# Patient Record
Sex: Female | Born: 1950
Health system: Southern US, Community
[De-identification: ages and names within clinical notes are randomized; demographics above are authoritative.]

## PROBLEM LIST (undated history)

## (undated) DIAGNOSIS — E785 Hyperlipidemia, unspecified: Secondary | ICD-10-CM

## (undated) DIAGNOSIS — N390 Urinary tract infection, site not specified: Secondary | ICD-10-CM

## (undated) DIAGNOSIS — J302 Other seasonal allergic rhinitis: Secondary | ICD-10-CM

## (undated) DIAGNOSIS — D696 Thrombocytopenia, unspecified: Secondary | ICD-10-CM

## (undated) HISTORY — PX: COLONOSCOPY: SHX174

---

## 1998-02-19 ENCOUNTER — Other Ambulatory Visit: Admission: RE | Admit: 1998-02-19 | Discharge: 1998-02-19 | Payer: Self-pay | Admitting: Family Medicine

## 1999-02-18 ENCOUNTER — Other Ambulatory Visit: Admission: RE | Admit: 1999-02-18 | Discharge: 1999-02-18 | Payer: Self-pay | Admitting: Family Medicine

## 2000-02-24 ENCOUNTER — Other Ambulatory Visit: Admission: RE | Admit: 2000-02-24 | Discharge: 2000-02-24 | Payer: Self-pay | Admitting: Family Medicine

## 2000-05-14 ENCOUNTER — Emergency Department (HOSPITAL_COMMUNITY): Admission: EM | Admit: 2000-05-14 | Discharge: 2000-05-14 | Payer: Self-pay | Admitting: Emergency Medicine

## 2003-10-05 ENCOUNTER — Ambulatory Visit (HOSPITAL_COMMUNITY): Admission: RE | Admit: 2003-10-05 | Discharge: 2003-10-05 | Payer: Self-pay | Admitting: *Deleted

## 2003-10-05 ENCOUNTER — Encounter (INDEPENDENT_AMBULATORY_CARE_PROVIDER_SITE_OTHER): Payer: Self-pay | Admitting: Specialist

## 2004-11-25 ENCOUNTER — Other Ambulatory Visit: Admission: RE | Admit: 2004-11-25 | Discharge: 2004-11-25 | Payer: Self-pay | Admitting: Family Medicine

## 2004-12-19 ENCOUNTER — Ambulatory Visit (HOSPITAL_COMMUNITY): Admission: RE | Admit: 2004-12-19 | Discharge: 2004-12-19 | Payer: Self-pay | Admitting: *Deleted

## 2004-12-19 ENCOUNTER — Encounter (INDEPENDENT_AMBULATORY_CARE_PROVIDER_SITE_OTHER): Payer: Self-pay | Admitting: Specialist

## 2005-07-09 ENCOUNTER — Emergency Department (HOSPITAL_COMMUNITY): Admission: EM | Admit: 2005-07-09 | Discharge: 2005-07-09 | Payer: Self-pay | Admitting: Family Medicine

## 2006-08-20 ENCOUNTER — Other Ambulatory Visit: Admission: RE | Admit: 2006-08-20 | Discharge: 2006-08-20 | Payer: Self-pay | Admitting: Family Medicine

## 2007-11-30 ENCOUNTER — Other Ambulatory Visit: Admission: RE | Admit: 2007-11-30 | Discharge: 2007-11-30 | Payer: Self-pay | Admitting: Family Medicine

## 2009-02-22 ENCOUNTER — Other Ambulatory Visit: Admission: RE | Admit: 2009-02-22 | Discharge: 2009-02-22 | Payer: Self-pay | Admitting: Family Medicine

## 2011-02-25 ENCOUNTER — Other Ambulatory Visit: Payer: Self-pay | Admitting: Family Medicine

## 2011-02-25 ENCOUNTER — Other Ambulatory Visit (HOSPITAL_COMMUNITY)
Admission: RE | Admit: 2011-02-25 | Discharge: 2011-02-25 | Disposition: A | Discharge status: Home / Self Care | Payer: BC Managed Care – PPO | Source: Ambulatory Visit | Attending: Family Medicine | Admitting: Family Medicine

## 2011-02-25 DIAGNOSIS — Z124 Encounter for screening for malignant neoplasm of cervix: Secondary | ICD-10-CM | POA: Insufficient documentation

## 2013-01-04 ENCOUNTER — Other Ambulatory Visit: Payer: Self-pay | Admitting: Family Medicine

## 2013-01-04 DIAGNOSIS — M949 Disorder of cartilage, unspecified: Secondary | ICD-10-CM

## 2013-10-10 ENCOUNTER — Other Ambulatory Visit: Payer: Self-pay | Admitting: Family Medicine

## 2013-10-10 ENCOUNTER — Other Ambulatory Visit (HOSPITAL_COMMUNITY)
Admission: RE | Admit: 2013-10-10 | Discharge: 2013-10-10 | Disposition: A | Discharge status: Home / Self Care | Payer: BC Managed Care – PPO | Source: Ambulatory Visit | Attending: Family Medicine | Admitting: Family Medicine

## 2013-10-10 DIAGNOSIS — Z1151 Encounter for screening for human papillomavirus (HPV): Secondary | ICD-10-CM | POA: Insufficient documentation

## 2013-10-10 DIAGNOSIS — Z124 Encounter for screening for malignant neoplasm of cervix: Secondary | ICD-10-CM | POA: Insufficient documentation

## 2014-06-20 ENCOUNTER — Ambulatory Visit: Payer: BC Managed Care – PPO | Attending: Family Medicine | Admitting: Physical Therapy

## 2014-06-20 DIAGNOSIS — M25619 Stiffness of unspecified shoulder, not elsewhere classified: Secondary | ICD-10-CM | POA: Insufficient documentation

## 2014-06-20 DIAGNOSIS — M949 Disorder of cartilage, unspecified: Secondary | ICD-10-CM

## 2014-06-20 DIAGNOSIS — M25519 Pain in unspecified shoulder: Secondary | ICD-10-CM | POA: Insufficient documentation

## 2014-06-20 DIAGNOSIS — M899 Disorder of bone, unspecified: Secondary | ICD-10-CM | POA: Diagnosis not present

## 2014-06-20 DIAGNOSIS — IMO0001 Reserved for inherently not codable concepts without codable children: Secondary | ICD-10-CM | POA: Diagnosis not present

## 2014-06-25 ENCOUNTER — Ambulatory Visit: Payer: BC Managed Care – PPO | Attending: Family Medicine | Admitting: Physical Therapy

## 2014-06-25 DIAGNOSIS — M8580 Other specified disorders of bone density and structure, unspecified site: Secondary | ICD-10-CM | POA: Insufficient documentation

## 2014-06-25 DIAGNOSIS — Z5189 Encounter for other specified aftercare: Secondary | ICD-10-CM | POA: Diagnosis present

## 2014-06-25 DIAGNOSIS — M25512 Pain in left shoulder: Secondary | ICD-10-CM | POA: Insufficient documentation

## 2014-06-25 DIAGNOSIS — M25612 Stiffness of left shoulder, not elsewhere classified: Secondary | ICD-10-CM | POA: Insufficient documentation

## 2014-06-27 ENCOUNTER — Ambulatory Visit: Payer: BC Managed Care – PPO | Admitting: Physical Therapy

## 2014-06-27 DIAGNOSIS — Z5189 Encounter for other specified aftercare: Secondary | ICD-10-CM | POA: Diagnosis not present

## 2014-06-29 ENCOUNTER — Ambulatory Visit: Payer: BC Managed Care – PPO | Admitting: Physical Therapy

## 2014-06-29 DIAGNOSIS — Z5189 Encounter for other specified aftercare: Secondary | ICD-10-CM | POA: Diagnosis not present

## 2014-07-02 ENCOUNTER — Ambulatory Visit: Payer: BC Managed Care – PPO | Admitting: Physical Therapy

## 2014-07-02 DIAGNOSIS — Z5189 Encounter for other specified aftercare: Secondary | ICD-10-CM | POA: Diagnosis not present

## 2014-07-04 ENCOUNTER — Ambulatory Visit: Payer: BC Managed Care – PPO | Admitting: Physical Therapy

## 2014-07-04 DIAGNOSIS — Z5189 Encounter for other specified aftercare: Secondary | ICD-10-CM | POA: Diagnosis not present

## 2014-07-06 ENCOUNTER — Ambulatory Visit: Payer: BC Managed Care – PPO | Admitting: Physical Therapy

## 2014-07-09 ENCOUNTER — Ambulatory Visit: Payer: BC Managed Care – PPO | Admitting: Physical Therapy

## 2014-07-09 DIAGNOSIS — Z5189 Encounter for other specified aftercare: Secondary | ICD-10-CM | POA: Diagnosis not present

## 2014-07-11 ENCOUNTER — Encounter: Payer: BC Managed Care – PPO | Admitting: Physical Therapy

## 2014-07-13 ENCOUNTER — Ambulatory Visit: Payer: BC Managed Care – PPO | Admitting: Physical Therapy

## 2014-07-13 DIAGNOSIS — Z5189 Encounter for other specified aftercare: Secondary | ICD-10-CM | POA: Diagnosis not present

## 2014-07-16 ENCOUNTER — Ambulatory Visit: Payer: BC Managed Care – PPO | Admitting: Physical Therapy

## 2014-07-16 DIAGNOSIS — Z5189 Encounter for other specified aftercare: Secondary | ICD-10-CM | POA: Diagnosis not present

## 2014-07-18 ENCOUNTER — Encounter: Payer: BC Managed Care – PPO | Admitting: Physical Therapy

## 2014-07-20 ENCOUNTER — Encounter: Payer: BC Managed Care – PPO | Admitting: Physical Therapy

## 2014-07-23 ENCOUNTER — Encounter: Payer: BC Managed Care – PPO | Admitting: Physical Therapy

## 2014-07-25 ENCOUNTER — Encounter: Payer: BC Managed Care – PPO | Admitting: Physical Therapy

## 2014-07-27 ENCOUNTER — Encounter: Payer: BC Managed Care – PPO | Admitting: Physical Therapy

## 2016-11-17 ENCOUNTER — Other Ambulatory Visit: Payer: Self-pay | Admitting: Family Medicine

## 2016-11-17 ENCOUNTER — Other Ambulatory Visit (HOSPITAL_COMMUNITY)
Admission: RE | Admit: 2016-11-17 | Discharge: 2016-11-17 | Disposition: A | Discharge status: Home / Self Care | Payer: BC Managed Care – PPO | Source: Ambulatory Visit | Attending: Family Medicine | Admitting: Family Medicine

## 2016-11-17 DIAGNOSIS — Z01411 Encounter for gynecological examination (general) (routine) with abnormal findings: Secondary | ICD-10-CM | POA: Diagnosis present

## 2016-11-17 DIAGNOSIS — Z1151 Encounter for screening for human papillomavirus (HPV): Secondary | ICD-10-CM | POA: Diagnosis present

## 2016-11-18 LAB — CYTOLOGY - PAP
Diagnosis: NEGATIVE
HPV: NOT DETECTED

## 2017-04-29 ENCOUNTER — Encounter (HOSPITAL_COMMUNITY): Payer: Self-pay | Admitting: Emergency Medicine

## 2017-04-29 ENCOUNTER — Emergency Department (HOSPITAL_COMMUNITY)
Admission: EM | Admit: 2017-04-29 | Discharge: 2017-04-29 | Disposition: A | Discharge status: Home / Self Care | Payer: BC Managed Care – PPO | Attending: Emergency Medicine | Admitting: Emergency Medicine

## 2017-04-29 ENCOUNTER — Emergency Department (HOSPITAL_COMMUNITY): Payer: BC Managed Care – PPO

## 2017-04-29 DIAGNOSIS — Y929 Unspecified place or not applicable: Secondary | ICD-10-CM | POA: Diagnosis not present

## 2017-04-29 DIAGNOSIS — W1830XA Fall on same level, unspecified, initial encounter: Secondary | ICD-10-CM | POA: Diagnosis not present

## 2017-04-29 DIAGNOSIS — Y999 Unspecified external cause status: Secondary | ICD-10-CM | POA: Insufficient documentation

## 2017-04-29 DIAGNOSIS — S82032A Displaced transverse fracture of left patella, initial encounter for closed fracture: Secondary | ICD-10-CM | POA: Insufficient documentation

## 2017-04-29 DIAGNOSIS — Y939 Activity, unspecified: Secondary | ICD-10-CM | POA: Diagnosis not present

## 2017-04-29 DIAGNOSIS — S8992XA Unspecified injury of left lower leg, initial encounter: Secondary | ICD-10-CM | POA: Diagnosis present

## 2017-04-29 HISTORY — DX: Other seasonal allergic rhinitis: J30.2

## 2017-04-29 HISTORY — DX: Urinary tract infection, site not specified: N39.0

## 2017-04-29 HISTORY — DX: Hyperlipidemia, unspecified: E78.5

## 2017-04-29 MED ORDER — MORPHINE SULFATE 15 MG PO TABS: 15.0000 mg | ORAL_TABLET | ORAL | 0 refills | Status: DC | PRN

## 2017-04-29 NOTE — ED Notes (Signed)
Ice applied. Warm blanket given. Call bell within reach.

## 2017-04-29 NOTE — Discharge Instructions (Signed)

## 2017-04-29 NOTE — ED Notes (Signed)
Bed: [id number] Expected date:  Expected time:  Means of arrival:  Comments: EMS 66 yo F left knee deformity

## 2017-04-29 NOTE — ED Provider Notes (Signed)
[facility]-EMERGENCY DEPT Provider Note   CSN: [id number] Arrival date & time: 04/29/17  4098     History   Chief Complaint Chief Complaint  Patient presents with  . Fall  . Knee Pain    HPI Alejandra Thompson is a 66 y.o. female.  66 yo F with a chief complaint of left knee pain. The patient slipped on a recently clean floor and landed on her kneecap. Noted deformity to the area. Having significant pain with flexion of her knee. When it is straight she feels that she has no tenderness. Denies other injury. Denies prior issues with that knee.   The history is provided by the patient.  Fall  This is a new problem. The current episode started less than 1 hour ago. The problem occurs constantly. The problem has not changed since onset.Pertinent negatives include no chest pain, no headaches and no shortness of breath. The symptoms are aggravated by bending and twisting (flexion). Nothing relieves the symptoms. She has tried nothing for the symptoms. The treatment provided no relief.  Knee Pain      Past Medical History:  Diagnosis Date  . Hyperlipemia   . Seasonal allergies   . UTI (urinary tract infection)     There are no active problems to display for this patient.   History reviewed. No pertinent surgical history.  OB History    No data available       Home Medications    Prior to Admission medications   Medication Sig Start Date End Date Taking? Authorizing Provider  Aspirin-Acetaminophen-Caffeine (GOODY HEADACHE PO) Take 1 packet by mouth every 6 (six) hours as needed (for pain).   Yes [provider]  BIOTIN PO Take 2 each by mouth daily.   Yes [provider]  ibuprofen (ADVIL,MOTRIN) 200 MG tablet Take 400 mg by mouth at bedtime.   Yes [provider]  montelukast (SINGULAIR) 10 MG tablet Take 10 mg by mouth daily.    Yes [provider]  nitrofurantoin (MACRODANTIN) 100 MG capsule Take 100 mg by mouth daily.   Yes [provider]  simvastatin (ZOCOR) 20 MG tablet Take 20 mg by mouth daily.   Yes [provider]  zolpidem (AMBIEN) 5 MG tablet Take 5 mg by mouth at bedtime as needed for sleep.   Yes [provider]  morphine (MSIR) 15 MG tablet Take 1 tablet (15 mg total) by mouth every 4 (four) hours as needed for severe pain. 04/29/17   [name], DO    Family History No family history on file.  Social History Social History  Substance Use Topics  . Smoking status: Former Games developer  . Smokeless tobacco: Never Used  . Alcohol use Yes     Comment: social     Allergies   Patient has no known allergies.   Review of Systems Review of Systems  Constitutional: Negative for chills and fever.  HENT: Negative for congestion and rhinorrhea.   Eyes: Negative for redness and visual disturbance.  Respiratory: Negative for shortness of breath and wheezing.   Cardiovascular: Negative for chest pain and palpitations.  Gastrointestinal: Negative for nausea and vomiting.  Genitourinary: Negative for dysuria and urgency.  Musculoskeletal: Positive for arthralgias. Negative for myalgias.  Skin: Negative for pallor and wound.  Neurological: Negative for dizziness and headaches.     Physical Exam Updated Vital Signs BP 125/66 (BP Location: Left Arm)   Pulse 60   Temp 98.2 F (36.8 C) (Oral)  Resp 16   Ht 5\' 7"  (1.702 m)   Wt 77.1 kg (170 lb)   SpO2 100%   BMI 26.63 kg/m   Physical Exam  Constitutional: She is oriented to person, place, and time. She appears well-developed and well-nourished. No distress.  HENT:  Head: Normocephalic and atraumatic.  Eyes: Pupils are equal, round, and reactive to light. EOM are normal.  Neck: Normal range of motion. Neck supple.  Cardiovascular: Normal rate and regular rhythm.  Exam reveals no gallop and no friction rub.   No murmur heard. Pulmonary/Chest: Effort normal. She has no wheezes. She has no rales.  Abdominal: Soft. She exhibits no  distension. There is no tenderness.  Musculoskeletal: She exhibits edema and tenderness.  Pulse motor and sensation is intact to the left lower extremity. She is unable to raise her leg off the bed. Deformity noted to the patella  Neurological: She is alert and oriented to person, place, and time.  Skin: Skin is warm and dry. She is not diaphoretic.  Psychiatric: She has a normal mood and affect. Her behavior is normal.  Nursing note and vitals reviewed.    ED Treatments / Results  Labs (all labs ordered are listed, but only abnormal results are displayed) Labs Reviewed - No data to display  EKG  EKG Interpretation None       Radiology Dg Knee Complete 4 Views Left  Result Date: 04/29/2017 CLINICAL DATA:  Fall at school this morning with left knee pain. Initial encounter. EXAM: LEFT KNEE - COMPLETE 4+ VIEW COMPARISON:  None. FINDINGS: Comminuted and distracted fracture horizontally across the mid patella. Associated soft tissue swelling. No noted femur, tibial, or fibular fracture. IMPRESSION: Distracted transverse mid patella fracture. Electronically Signed   By: Marnee SpringJonathon  Watts M.D.   On: 04/29/2017 10:36    Procedures Procedures (including critical care time)  Medications Ordered in ED Medications - No data to display   Initial Impression / Assessment and Plan / ED Course  I have reviewed the triage vital signs and the nursing notes.  Pertinent labs & imaging results that were available during my care of the patient were reviewed by me and considered in my medical decision making (see chart for details).     66 yo F with a chief complaint of left knee pain. Clinically patient has a fractured patella. Will obtain a plain film.  Grossly displaced patellar fx.  Unable to straight leg raise.  Discussed with Dr. Linna CapriceSwinteck, recommended knee immobilizer crutches.  Follow up early next week.  WBAT.    11:47 AM:  I have discussed the diagnosis/risks/treatment options with the  patient and family and believe the pt to be eligible for discharge home to follow-up with Ortho. We also discussed returning to the ED immediately if new or worsening sx occur. We discussed the sx which are most concerning (e.g., sudden worsening pain, fever, inability to tolerate by mouth) that necessitate immediate return. Medications administered to the patient during their visit and any new prescriptions provided to the patient are listed below.  Medications given during this visit Medications - No data to display   The patient appears reasonably screen and/or stabilized for discharge and I doubt any other medical condition or other [facility] requiring further screening, evaluation, or treatment in the ED at this time prior to discharge.  \  Final Clinical Impressions(s) / ED Diagnoses   Final diagnoses:  Closed displaced transverse fracture of left patella, initial encounter    New Prescriptions New  Prescriptions   MORPHINE (MSIR) 15 MG TABLET    Take 1 tablet (15 mg total) by mouth every 4 (four) hours as needed for severe pain.     [name], DO 04/29/17 1147

## 2017-04-29 NOTE — ED Triage Notes (Addendum)
Per EMS pt complaint of left knee pain/deformity as result of falling on waxed floor; no neck/back pain or LOC.

## 2017-04-29 NOTE — ED Notes (Signed)
Ortho Tech at bedside.  

## 2017-05-04 ENCOUNTER — Ambulatory Visit: Payer: Self-pay | Admitting: Orthopedic Surgery

## 2017-05-06 ENCOUNTER — Encounter (HOSPITAL_COMMUNITY): Payer: Self-pay | Admitting: *Deleted

## 2017-05-06 MED ORDER — CEFAZOLIN SODIUM-DEXTROSE 2-4 GM/100ML-% IV SOLN
2.0000 g | INTRAVENOUS | Status: AC
  Administered 2017-05-07: 2 g via INTRAVENOUS
  Filled 2017-05-06: qty 100

## 2017-05-06 NOTE — Progress Notes (Signed)
Spoke with pt for pre-op call. Pt denies cardiac history, chest pain or sob. Pt states she is not diabetic.  

## 2017-05-07 ENCOUNTER — Ambulatory Visit (HOSPITAL_COMMUNITY): Payer: BC Managed Care – PPO | Admitting: Anesthesiology

## 2017-05-07 ENCOUNTER — Encounter (HOSPITAL_COMMUNITY)
Admission: RE | Disposition: A | Discharge status: Home / Self Care | Payer: Self-pay | Source: Ambulatory Visit | Attending: Orthopedic Surgery

## 2017-05-07 ENCOUNTER — Ambulatory Visit (HOSPITAL_COMMUNITY): Payer: BC Managed Care – PPO

## 2017-05-07 ENCOUNTER — Encounter (HOSPITAL_COMMUNITY): Payer: Self-pay | Admitting: *Deleted

## 2017-05-07 ENCOUNTER — Ambulatory Visit (HOSPITAL_COMMUNITY)
Admission: RE | Admit: 2017-05-07 | Discharge: 2017-05-07 | Disposition: A | Discharge status: Home / Self Care | Payer: BC Managed Care – PPO | Source: Ambulatory Visit | Attending: Orthopedic Surgery | Admitting: Orthopedic Surgery

## 2017-05-07 DIAGNOSIS — Z8489 Family history of other specified conditions: Secondary | ICD-10-CM | POA: Insufficient documentation

## 2017-05-07 DIAGNOSIS — S82002A Unspecified fracture of left patella, initial encounter for closed fracture: Secondary | ICD-10-CM | POA: Diagnosis present

## 2017-05-07 DIAGNOSIS — D696 Thrombocytopenia, unspecified: Secondary | ICD-10-CM | POA: Insufficient documentation

## 2017-05-07 DIAGNOSIS — Z87891 Personal history of nicotine dependence: Secondary | ICD-10-CM | POA: Insufficient documentation

## 2017-05-07 DIAGNOSIS — Z87440 Personal history of urinary (tract) infections: Secondary | ICD-10-CM | POA: Insufficient documentation

## 2017-05-07 DIAGNOSIS — E785 Hyperlipidemia, unspecified: Secondary | ICD-10-CM | POA: Insufficient documentation

## 2017-05-07 DIAGNOSIS — S82032A Displaced transverse fracture of left patella, initial encounter for closed fracture: Secondary | ICD-10-CM | POA: Diagnosis not present

## 2017-05-07 DIAGNOSIS — Z7982 Long term (current) use of aspirin: Secondary | ICD-10-CM | POA: Diagnosis not present

## 2017-05-07 DIAGNOSIS — W19XXXA Unspecified fall, initial encounter: Secondary | ICD-10-CM | POA: Diagnosis not present

## 2017-05-07 DIAGNOSIS — Z8249 Family history of ischemic heart disease and other diseases of the circulatory system: Secondary | ICD-10-CM | POA: Diagnosis not present

## 2017-05-07 DIAGNOSIS — Y939 Activity, unspecified: Secondary | ICD-10-CM | POA: Diagnosis not present

## 2017-05-07 DIAGNOSIS — Z79899 Other long term (current) drug therapy: Secondary | ICD-10-CM | POA: Diagnosis not present

## 2017-05-07 DIAGNOSIS — Z419 Encounter for procedure for purposes other than remedying health state, unspecified: Secondary | ICD-10-CM

## 2017-05-07 HISTORY — PX: ORIF PATELLA: SHX5033

## 2017-05-07 HISTORY — DX: Thrombocytopenia, unspecified: D69.6

## 2017-05-07 LAB — CBC
HCT: 37.5 % (ref 36.0–46.0)
HEMOGLOBIN: 12.4 g/dL (ref 12.0–15.0)
MCH: 28.9 pg (ref 26.0–34.0)
MCHC: 33.1 g/dL (ref 30.0–36.0)
MCV: 87.4 fL (ref 78.0–100.0)
Platelets: 236 10*3/uL (ref 150–400)
RBC: 4.29 MIL/uL (ref 3.87–5.11)
RDW: 14.3 % (ref 11.5–15.5)
WBC: 7.5 10*3/uL (ref 4.0–10.5)

## 2017-05-07 SURGERY — OPEN REDUCTION INTERNAL FIXATION (ORIF) PATELLA
Anesthesia: General | Site: Knee | Laterality: Left

## 2017-05-07 MED ORDER — CHLORHEXIDINE GLUCONATE 4 % EX LIQD: 60.0000 mL | Freq: Once | CUTANEOUS | Status: DC

## 2017-05-07 MED ORDER — SENNA 8.6 MG PO TABS: 2.0000 | ORAL_TABLET | Freq: Every day | ORAL | 3 refills | Status: DC

## 2017-05-07 MED ORDER — LIDOCAINE 2% (20 MG/ML) 5 ML SYRINGE
INTRAMUSCULAR | Status: AC
  Filled 2017-05-07: qty 5

## 2017-05-07 MED ORDER — DOCUSATE SODIUM 100 MG PO CAPS: 100.0000 mg | ORAL_CAPSULE | Freq: Two times a day (BID) | ORAL | 3 refills | Status: DC

## 2017-05-07 MED ORDER — OXYCODONE-ACETAMINOPHEN 5-325 MG PO TABS
ORAL_TABLET | ORAL | Status: AC
  Filled 2017-05-07: qty 2

## 2017-05-07 MED ORDER — HYDROMORPHONE HCL 1 MG/ML IJ SOLN: 0.5000 mg | INTRAMUSCULAR | Status: DC | PRN

## 2017-05-07 MED ORDER — FENTANYL CITRATE (PF) 250 MCG/5ML IJ SOLN
INTRAMUSCULAR | Status: AC
  Filled 2017-05-07: qty 5

## 2017-05-07 MED ORDER — ROCURONIUM BROMIDE 10 MG/ML (PF) SYRINGE
PREFILLED_SYRINGE | INTRAVENOUS | Status: AC
  Filled 2017-05-07: qty 5

## 2017-05-07 MED ORDER — LIDOCAINE 2% (20 MG/ML) 5 ML SYRINGE
INTRAMUSCULAR | Status: DC | PRN
  Administered 2017-05-07: 80 mg via INTRAVENOUS

## 2017-05-07 MED ORDER — BUPIVACAINE-EPINEPHRINE (PF) 0.25% -1:200000 IJ SOLN
INTRAMUSCULAR | Status: AC
  Filled 2017-05-07: qty 30

## 2017-05-07 MED ORDER — ONDANSETRON HCL 4 MG/2ML IJ SOLN: 4.0000 mg | Freq: Four times a day (QID) | INTRAMUSCULAR | Status: DC | PRN

## 2017-05-07 MED ORDER — EPHEDRINE 5 MG/ML INJ
INTRAVENOUS | Status: AC
  Filled 2017-05-07: qty 10

## 2017-05-07 MED ORDER — HYDROMORPHONE HCL 1 MG/ML IJ SOLN
INTRAMUSCULAR | Status: AC
  Administered 2017-05-07: 0.25 mg via INTRAVENOUS
  Filled 2017-05-07: qty 1

## 2017-05-07 MED ORDER — SUCCINYLCHOLINE CHLORIDE 200 MG/10ML IV SOSY
PREFILLED_SYRINGE | INTRAVENOUS | Status: AC
  Filled 2017-05-07: qty 10

## 2017-05-07 MED ORDER — ONDANSETRON HCL 4 MG PO TABS: 4.0000 mg | ORAL_TABLET | Freq: Four times a day (QID) | ORAL | Status: DC | PRN

## 2017-05-07 MED ORDER — FENTANYL CITRATE (PF) 250 MCG/5ML IJ SOLN
INTRAMUSCULAR | Status: DC | PRN
  Administered 2017-05-07: 50 ug via INTRAVENOUS
  Administered 2017-05-07 (×2): 25 ug via INTRAVENOUS

## 2017-05-07 MED ORDER — ONDANSETRON HCL 4 MG PO TABS: 4.0000 mg | ORAL_TABLET | Freq: Three times a day (TID) | ORAL | 0 refills | Status: DC | PRN

## 2017-05-07 MED ORDER — PHENYLEPHRINE 40 MCG/ML (10ML) SYRINGE FOR IV PUSH (FOR BLOOD PRESSURE SUPPORT)
PREFILLED_SYRINGE | INTRAVENOUS | Status: AC
  Filled 2017-05-07: qty 10

## 2017-05-07 MED ORDER — ROPIVACAINE HCL 5 MG/ML IJ SOLN
INTRAMUSCULAR | Status: DC | PRN
  Administered 2017-05-07: 30 mL via PERINEURAL

## 2017-05-07 MED ORDER — PROPOFOL 10 MG/ML IV BOLUS
INTRAVENOUS | Status: DC | PRN
  Administered 2017-05-07: 200 mg via INTRAVENOUS

## 2017-05-07 MED ORDER — HYDROMORPHONE HCL 1 MG/ML IJ SOLN
0.2500 mg | INTRAMUSCULAR | Status: DC | PRN
  Administered 2017-05-07: 0.5 mg via INTRAVENOUS
  Administered 2017-05-07 (×2): 0.25 mg via INTRAVENOUS

## 2017-05-07 MED ORDER — OXYCODONE HCL 5 MG/5ML PO SOLN: 5.0000 mg | Freq: Once | ORAL | Status: DC | PRN

## 2017-05-07 MED ORDER — 0.9 % SODIUM CHLORIDE (POUR BTL) OPTIME
TOPICAL | Status: DC | PRN
  Administered 2017-05-07: 1000 mL

## 2017-05-07 MED ORDER — METHOCARBAMOL 500 MG PO TABS: 500.0000 mg | ORAL_TABLET | Freq: Four times a day (QID) | ORAL | Status: DC | PRN

## 2017-05-07 MED ORDER — PROPOFOL 10 MG/ML IV BOLUS
INTRAVENOUS | Status: AC
  Filled 2017-05-07: qty 20

## 2017-05-07 MED ORDER — LACTATED RINGERS IV SOLN
INTRAVENOUS | Status: DC | PRN
  Administered 2017-05-07: 07:00:00 via INTRAVENOUS

## 2017-05-07 MED ORDER — METHOCARBAMOL 1000 MG/10ML IJ SOLN: 500.0000 mg | Freq: Four times a day (QID) | INTRAMUSCULAR | Status: DC | PRN

## 2017-05-07 MED ORDER — OXYCODONE-ACETAMINOPHEN 5-325 MG PO TABS: 1.0000 | ORAL_TABLET | ORAL | 0 refills | Status: DC | PRN

## 2017-05-07 MED ORDER — METOCLOPRAMIDE HCL 5 MG/ML IJ SOLN: 5.0000 mg | Freq: Three times a day (TID) | INTRAMUSCULAR | Status: DC | PRN

## 2017-05-07 MED ORDER — PROMETHAZINE HCL 25 MG/ML IJ SOLN: 6.2500 mg | INTRAMUSCULAR | Status: DC | PRN

## 2017-05-07 MED ORDER — OXYCODONE HCL 5 MG PO TABS: 5.0000 mg | ORAL_TABLET | Freq: Once | ORAL | Status: DC | PRN

## 2017-05-07 MED ORDER — MIDAZOLAM HCL 2 MG/2ML IJ SOLN
INTRAMUSCULAR | Status: AC
  Filled 2017-05-07: qty 2

## 2017-05-07 MED ORDER — MEPERIDINE HCL 25 MG/ML IJ SOLN: 6.2500 mg | INTRAMUSCULAR | Status: DC | PRN

## 2017-05-07 MED ORDER — MIDAZOLAM HCL 5 MG/5ML IJ SOLN
INTRAMUSCULAR | Status: DC | PRN
  Administered 2017-05-07 (×2): 1 mg via INTRAVENOUS

## 2017-05-07 MED ORDER — METOCLOPRAMIDE HCL 5 MG PO TABS: 5.0000 mg | ORAL_TABLET | Freq: Three times a day (TID) | ORAL | Status: DC | PRN

## 2017-05-07 MED ORDER — ASPIRIN EC 325 MG PO TBEC: 325.0000 mg | DELAYED_RELEASE_TABLET | Freq: Two times a day (BID) | ORAL | 0 refills | Status: DC

## 2017-05-07 MED ORDER — ONDANSETRON HCL 4 MG/2ML IJ SOLN
INTRAMUSCULAR | Status: DC | PRN
  Administered 2017-05-07: 4 mg via INTRAVENOUS

## 2017-05-07 MED ORDER — OXYCODONE-ACETAMINOPHEN 5-325 MG PO TABS
1.0000 | ORAL_TABLET | ORAL | Status: DC | PRN
  Administered 2017-05-07: 2 via ORAL

## 2017-05-07 SURGICAL SUPPLY — 68 items
BANDAGE ACE 4X5 VEL STRL LF (GAUZE/BANDAGES/DRESSINGS) ×1 IMPLANT
BANDAGE ACE 6X5 VEL STRL LF (GAUZE/BANDAGES/DRESSINGS) ×3 IMPLANT
BANDAGE ELASTIC 6 VELCRO ST LF (GAUZE/BANDAGES/DRESSINGS) ×2 IMPLANT
BANDAGE ESMARK 6X9 LF (GAUZE/BANDAGES/DRESSINGS) ×1 IMPLANT
BIT DRILL 2.9 CANN QC NONSTRL (BIT) IMPLANT
BNDG CMPR 9X6 STRL LF SNTH (GAUZE/BANDAGES/DRESSINGS) ×1
BNDG COHESIVE 4X5 TAN STRL (GAUZE/BANDAGES/DRESSINGS) ×3 IMPLANT
BNDG ESMARK 6X9 LF (GAUZE/BANDAGES/DRESSINGS) ×3
CHLORAPREP W/TINT 26ML (MISCELLANEOUS) ×3 IMPLANT
CLOSURE WOUND 1/2 X4 (GAUZE/BANDAGES/DRESSINGS) ×1
COVER SURGICAL LIGHT HANDLE (MISCELLANEOUS) ×3 IMPLANT
CUFF TOURNIQUET SINGLE 34IN LL (TOURNIQUET CUFF) ×3 IMPLANT
DECANTER SPIKE VIAL GLASS SM (MISCELLANEOUS) ×1 IMPLANT
DRAPE C-ARM 42X72 X-RAY (DRAPES) ×3 IMPLANT
DRAPE C-ARMOR (DRAPES) ×3 IMPLANT
DRAPE HALF SHEET 40X57 (DRAPES) ×1 IMPLANT
DRAPE INCISE IOBAN 66X45 STRL (DRAPES) ×4 IMPLANT
DRAPE ORTHO SPLIT 77X108 STRL (DRAPES) ×6
DRAPE SURG ORHT 6 SPLT 77X108 (DRAPES) IMPLANT
DRSG ADAPTIC 3X8 NADH LF (GAUZE/BANDAGES/DRESSINGS) ×1 IMPLANT
DRSG AQUACEL AG ADV 3.5X10 (GAUZE/BANDAGES/DRESSINGS) ×2 IMPLANT
DRSG PAD ABDOMINAL 8X10 ST (GAUZE/BANDAGES/DRESSINGS) ×1 IMPLANT
ELECT REM PT RETURN 9FT ADLT (ELECTROSURGICAL) ×3
ELECTRODE REM PT RTRN 9FT ADLT (ELECTROSURGICAL) ×1 IMPLANT
FACESHIELD WRAPAROUND (MASK) ×3 IMPLANT
FACESHIELD WRAPAROUND OR TEAM (MASK) ×1 IMPLANT
GAUZE SPONGE 4X4 12PLY STRL (GAUZE/BANDAGES/DRESSINGS) ×2 IMPLANT
GLOVE BIO SURGEON STRL SZ8.5 (GLOVE) ×7 IMPLANT
GLOVE BIOGEL PI IND STRL 8.5 (GLOVE) ×1 IMPLANT
GLOVE BIOGEL PI INDICATOR 8.5 (GLOVE) ×2
GOWN SPEC L3 XXLG W/TWL (GOWN DISPOSABLE) ×6 IMPLANT
GOWN STRL REUS W/TWL LRG LVL3 (GOWN DISPOSABLE) ×5 IMPLANT
IMMOBILIZER KNEE 22 (SOFTGOODS) ×3 IMPLANT
K-WIRE ACE 1.6X6 (WIRE) ×9
KWIRE ACE 1.6X6 (WIRE) IMPLANT
MANIFOLD NEPTUNE II (INSTRUMENTS) ×3 IMPLANT
NDL 1/2 CIR MAYO (NEEDLE) ×1 IMPLANT
NEEDLE 1/2 CIR MAYO (NEEDLE) ×3 IMPLANT
NS IRRIG 1000ML POUR BTL (IV SOLUTION) ×3 IMPLANT
PACK ORTHO EXTREMITY (CUSTOM PROCEDURE TRAY) ×3 IMPLANT
PAD ARMBOARD 7.5X6 YLW CONV (MISCELLANEOUS) ×3 IMPLANT
PAD CAST 4YDX4 CTTN HI CHSV (CAST SUPPLIES) ×2 IMPLANT
PADDING CAST ABS 6INX4YD NS (CAST SUPPLIES)
PADDING CAST ABS COTTON 6X4 NS (CAST SUPPLIES) ×2 IMPLANT
PADDING CAST COTTON 4X4 STRL (CAST SUPPLIES)
RETRIEVER SUT HEWSON (MISCELLANEOUS) ×3 IMPLANT
SCREW ACE CAN 4.0 36M (Screw) ×2 IMPLANT
SCREW ACE CAN 4.0 42M (Screw) ×2 IMPLANT
SOL PREP POV-IOD 4OZ 10% (MISCELLANEOUS) ×1 IMPLANT
SPONGE LAP 4X18 X RAY DECT (DISPOSABLE) ×1 IMPLANT
STOCKINETTE IMPERVIOUS 9X36 MD (GAUZE/BANDAGES/DRESSINGS) ×3 IMPLANT
STRIP CLOSURE SKIN 1/2X4 (GAUZE/BANDAGES/DRESSINGS) ×2 IMPLANT
SUT ETHILON 3 0 PS 1 (SUTURE) ×4 IMPLANT
SUT FIBERWIRE #2 38 T-5 BLUE (SUTURE) ×6
SUT MNCRL AB 4-0 PS2 18 (SUTURE) ×3 IMPLANT
SUT MON AB 2-0 CT1 27 (SUTURE) ×3 IMPLANT
SUT VIC AB 1 CT1 27 (SUTURE) ×9
SUT VIC AB 1 CT1 27XBRD ANBCTR (SUTURE) IMPLANT
SUT VIC AB 1 CT1 27XBRD ANTBC (SUTURE) ×2 IMPLANT
SUT VIC AB 2-0 CT1 27 (SUTURE) ×3
SUT VIC AB 2-0 CT1 TAPERPNT 27 (SUTURE) IMPLANT
SUT VIC AB 3-0 PS2 18 (SUTURE) ×3
SUT VIC AB 3-0 PS2 18XBRD (SUTURE) IMPLANT
SUTURE FIBERWR #2 38 T-5 BLUE (SUTURE) ×1 IMPLANT
TOWEL OR 17X26 10 PK STRL BLUE (TOWEL DISPOSABLE) ×6 IMPLANT
TUBE CONNECTING 12'X1/4 (SUCTIONS) ×1
TUBE CONNECTING 12X1/4 (SUCTIONS) ×2 IMPLANT
YANKAUER SUCT BULB TIP NO VENT (SUCTIONS) ×3 IMPLANT

## 2017-05-07 NOTE — Discharge Instructions (Signed)
Wear knee immobilizer at all times. Weightbearing as tolerated in knee immobilizer. Do not bend your knee.

## 2017-05-07 NOTE — Anesthesia Procedure Notes (Signed)
Anesthesia Regional Block: Femoral nerve block   Pre-Anesthetic Checklist: ,, timeout performed, Correct Patient, Correct Site, Correct Laterality, Correct Procedure, Correct Position, site marked, Risks and benefits discussed,  Surgical consent,  Pre-op evaluation,  At surgeon's request and post-op pain management  Laterality: Left  Prep: Dura Prep       Needles:  Injection technique: Single-shot  Needle Type: Stimiplex     Needle Length: 9cm  Needle Gauge: 21     Additional Needles:   Procedures: ultrasound guided,,,,,,,,  Narrative:  Start time: 05/07/2017 7:19 AM End time: 05/07/2017 7:24 AM Injection made incrementally with aspirations every 5 mL.  Performed by: Personally  Anesthesiologist: Anitra Lauth RAY

## 2017-05-07 NOTE — Anesthesia Preprocedure Evaluation (Signed)
Anesthesia Evaluation  Patient identified by MRN, date of birth, ID band Patient awake    Reviewed: Allergy & Precautions, NPO status , Patient's Chart, lab work & pertinent test results  Airway Mallampati: II  TM Distance: >3 FB Neck ROM: Full    Dental no notable dental hx.    Pulmonary neg pulmonary ROS, former smoker,    Pulmonary exam normal breath sounds clear to auscultation       Cardiovascular negative cardio ROS Normal cardiovascular exam Rhythm:Regular Rate:Normal     Neuro/Psych negative neurological ROS  negative psych ROS   GI/Hepatic negative GI ROS, Neg liver ROS,   Endo/Other  negative endocrine ROS  Renal/GU negative Renal ROS  negative genitourinary   Musculoskeletal negative musculoskeletal ROS (+)   Abdominal   Peds negative pediatric ROS (+)  Hematology negative hematology ROS (+)   Anesthesia Other Findings   Reproductive/Obstetrics negative OB ROS                             Anesthesia Physical Anesthesia Plan  ASA: II  Anesthesia Plan: General   Post-op Pain Management: GA combined w/ Regional for post-op pain   Induction: Intravenous  PONV Risk Score and Plan: 3 and Ondansetron, Dexamethasone and Midazolam  Airway Management Planned: LMA  Additional Equipment:   Intra-op Plan:   Post-operative Plan: Extubation in OR  Informed Consent: I have reviewed the patients History and Physical, chart, labs and discussed the procedure including the risks, benefits and alternatives for the proposed anesthesia with the patient or authorized representative who has indicated his/her understanding and acceptance.   Dental advisory given  Plan Discussed with: CRNA  Anesthesia Plan Comments:         Anesthesia Quick Evaluation

## 2017-05-07 NOTE — Anesthesia Procedure Notes (Signed)
Procedure Name: LMA Insertion Date/Time: 05/07/2017 7:42 AM Performed by: Lucinda Dell Pre-anesthesia Checklist: Patient identified, Emergency Drugs available, Suction available and Patient being monitored Patient Re-evaluated:Patient Re-evaluated prior to induction Oxygen Delivery Method: Circle system utilized Preoxygenation: Pre-oxygenation with 100% oxygen Induction Type: IV induction Ventilation: Mask ventilation without difficulty LMA: LMA inserted LMA Size: 4.0 Tube type: Oral Number of attempts: 1 Placement Confirmation: positive ETCO2 and breath sounds checked- equal and bilateral Tube secured with: Tape Dental Injury: Teeth and Oropharynx as per pre-operative assessment

## 2017-05-07 NOTE — Anesthesia Postprocedure Evaluation (Signed)
Anesthesia Post Note  Patient: Alejandra Thompson  Procedure(s) Performed: Procedure(s) (LRB): OPEN REDUCTION INTERNAL (ORIF) FIXATION LEFT PATELLA (Left)     Patient location during evaluation: PACU Anesthesia Type: General Level of consciousness: awake and alert Pain management: pain level controlled Vital Signs Assessment: post-procedure vital signs reviewed and stable Respiratory status: spontaneous breathing, nonlabored ventilation and respiratory function stable Cardiovascular status: blood pressure returned to baseline and stable Postop Assessment: no signs of nausea or vomiting Anesthetic complications: no    Last Vitals:  Vitals:   05/07/17 0945 05/07/17 1010  BP: (!) 128/57 (!) 129/57  Pulse: 68 65  Resp: 17 (!) 21  Temp:  36.7 C  SpO2: 98% 99%    Last Pain:  Vitals:   05/07/17 0945  TempSrc:   PainSc: 3                  Lowella Curb

## 2017-05-07 NOTE — H&P (Signed)
PREOPERATIVE H&P  Chief Complaint: patella fracture left  HPI: Alejandra Thompson is a 66 y.o. female who presents for preoperative history and physical with a diagnosis of patella fracture left.  She was indicated for surgical management.   Past Medical History:  Diagnosis Date  . Hyperlipemia   . Platelets decreased (HCC)    pt states her platelets are "low to normal"  . Seasonal allergies   . UTI (urinary tract infection)    Past Surgical History:  Procedure Laterality Date  . COLONOSCOPY     Social History   Social History  . Marital status: Married    Spouse name: N/A  . Number of children: N/A  . Years of education: N/A   Social History Main Topics  . Smoking status: Former Smoker    Quit date: 05/07/2007  . Smokeless tobacco: Never Used  . Alcohol use Yes     Comment: social  . Drug use: No  . Sexual activity: Not Asked   Other Topics Concern  . None   Social History Narrative  . None   Family History  Problem Relation Age of Onset  . Scleroderma Mother   . Heart attack Father    Allergies  Allergen Reactions  . No Known Allergies    Prior to Admission medications   Medication Sig Start Date End Date Taking? Authorizing Provider  acetaminophen (TYLENOL) 500 MG tablet Take 1,000 mg by mouth every 6 (six) hours as needed (for pain).   Yes [provider]  Aspirin-Acetaminophen-Caffeine (GOODY HEADACHE PO) Take 1 packet by mouth every 8 (eight) hours as needed (for pain).    Yes [provider]  ibuprofen (ADVIL,MOTRIN) 200 MG tablet Take 400 mg by mouth every 8 (eight) hours as needed. Take 2 tablets (400 mg) scheduled each night   Yes [provider]  montelukast (SINGULAIR) 10 MG tablet Take 10 mg by mouth at bedtime.    Yes [provider]  nitrofurantoin (MACRODANTIN) 100 MG capsule Take 100 mg by mouth at bedtime.    Yes [provider]  simvastatin (ZOCOR) 20 MG tablet Take 20 mg by mouth every evening.    Yes  [provider]  zolpidem (AMBIEN) 5 MG tablet Take 5 mg by mouth at bedtime as needed for sleep.   Yes [provider]  morphine (MSIR) 15 MG tablet Take 1 tablet (15 mg total) by mouth every 4 (four) hours as needed for severe pain. Patient not taking: Reported on 05/05/2017 04/29/17   [name], DO     Positive ROS: All other systems have been reviewed and were otherwise negative with the exception of those mentioned in the HPI and as above.  Physical Exam: General: Alert, no acute distress Cardiovascular: No pedal edema Respiratory: No cyanosis, no use of accessory musculature GI: No organomegaly, abdomen is soft and non-tender Skin: No lesions in the area of chief complaint Neurologic: Sensation intact distally Psychiatric: Patient is competent for consent with normal mood and affect Lymphatic: No axillary or cervical lymphadenopathy  MUSCULOSKELETAL: Examination of the left knee reveals no skin wounds or lesions. She does have swelling and significant ecchymosis. She is unable to perform a straight leg raise. She is neurovascularly intact distally.  Assessment: patella fracture left  Plan: Plan for Procedure(s): OPEN REDUCTION INTERNAL (ORIF) FIXATION LEFT PATELLA  The risks benefits and alternatives were discussed with the patient including but not limited to the risks of nonoperative treatment, versus surgical intervention including infection, bleeding, nerve  injury, malunion, nonunion, stiffness, posttraumatic arthritis, blood clots, cardiopulmonary complications, morbidity, mortality, among others, and they were willing to proceed. The patient desires to be discharged after surgery.  Rennie Rouch, Cloyde Reams, MD Cell [phone]   05/07/2017 7:25 AM

## 2017-05-07 NOTE — Brief Op Note (Signed)
05/07/2017  8:45 AM  PATIENT:  Alejandra Thompson  66 y.o. female  PRE-OPERATIVE DIAGNOSIS:  patella fracture left  POST-OPERATIVE DIAGNOSIS:  patella fracture left  PROCEDURE:  Procedure(s): OPEN REDUCTION INTERNAL (ORIF) FIXATION LEFT PATELLA (Left)  SURGEON:  Surgeon(s) and Role:    * Samson Frederic, MD - Primary  PHYSICIAN ASSISTANT: none  ASSISTANTS: April green, rnfa   ANESTHESIA:   regional and general  EBL:  Total I/O In: 800 [I.V.:800] Out: 50 [Blood:50]  BLOOD ADMINISTERED:none  DRAINS: none   LOCAL MEDICATIONS USED:  NONE  SPECIMEN:  No Specimen  DISPOSITION OF SPECIMEN:  N/A  COUNTS:  YES  TOURNIQUET:   Total Tourniquet Time Documented: Thigh (Left) - 30 minutes Total: Thigh (Left) - 30 minutes   DICTATION: .Other Dictation: Dictation Number [id number]  PLAN OF CARE: Discharge to home after PACU  PATIENT DISPOSITION:  PACU - hemodynamically stable.   Delay start of Pharmacological VTE agent (>24hrs) due to surgical blood loss or risk of bleeding: not applicable

## 2017-05-07 NOTE — Transfer of Care (Signed)
Immediate Anesthesia Transfer of Care Note  Patient: Alejandra Thompson  Procedure(s) Performed: Procedure(s): OPEN REDUCTION INTERNAL (ORIF) FIXATION LEFT PATELLA (Left)  Patient Location: PACU  Anesthesia Type:GA combined with regional for post-op pain  Level of Consciousness: awake, alert  and oriented  Airway & Oxygen Therapy: Patient Spontanous Breathing  Post-op Assessment: Report given to RN and Post -op Vital signs reviewed and stable  Post vital signs: Reviewed and stable  Last Vitals:  Vitals:   05/07/17 0618  BP: (!) 143/60  Pulse: 82  Resp: 18  Temp: 37.2 C  SpO2: 99%    Last Pain:  Vitals:   05/07/17 0618  TempSrc: Oral         Complications: No apparent anesthesia complications

## 2017-05-07 NOTE — Op Note (Signed)
NAME:  Alejandra Thompson, Alejandra Thompson                      ACCOUNT NO.:  MEDICAL RECORD NO.:  [id number]  LOCATION:                                 FACILITY:  PHYSICIAN:  Samson Frederic, MD     DATE OF BIRTH:  Sep 17, 1951  DATE OF PROCEDURE:  05/07/2017 DATE OF DISCHARGE:                              OPERATIVE REPORT   SURGEON:  Samson Frederic, MD.  ASSISTANT:  April Green, RNFA.  PREOPERATIVE DIAGNOSIS:  Left patella fracture.  POSTOPERATIVE DIAGNOSIS:  Left patella fracture.  PROCEDURE PERFORMED:  Open reduction and internal fixation of left patella fracture.  ANESTHESIA:  Femoral block plus general.  IMPLANTS:  Biomet Ace 4.0 mm cannulated screws x2.  ANTIBIOTICS:  2 g Ancef.  COMPLICATIONS:  None.  TUBES AND DRAINS:  None.  TOURNIQUET TIME:  30 minutes.  DISPOSITION:  Stable to PACU.  INDICATIONS:  The patient is a 66 year old female who fell and injured her left lower extremity about just over a week ago.  She sustained a transverse patella fracture with comminution to the distal fracture fragment.  Fracture was displaced.  She was seen in the office.  She was indicated for open reduction and internal fixation.  The risks, benefits, alternatives were explained.  She elected to proceed.  PROCEDURE IN DETAIL:  I identified the patient in the holding area using 2 identifiers.  Anesthesia team placed a femoral block.  She was taken to the operating room and placed supine on the operating room table. General anesthesia was induced.  Nonsterile tourniquet was applied to the left thigh.  The left lower extremity was prepped and draped in normal sterile surgical fashion.  Time-out was called verifying side and site of surgery.  She did receive IV antibiotics within 60 minutes beginning the procedure.  I used gravity to exsanguinate the lower extremity.  The tourniquet was elevated to 300 mmHg.  I made a standard longitudinal incision, centered over the patella.  Full-thickness  skin flaps were created.  I was able to identify her fracture.  She had a large fracture hematoma.  I debrided the fracture hematoma with a rongeur.  Fracture site was irrigated.  The distal fragment did have some comminution.  I brought the knee in full extension.  I reduced the fracture.  I held it with a clamp.  I checked the reduction with biplanar fluoroscopy.  I then placed 2 guidepins in a retrograde fashion.  Pin position was checked on AP and lateral fluoroscopy views. I then sequentially measured the pins, drilled the near cortex and placed a total of 2 cannulated screws.  The screws got excellent purchase.  I then used a #2 FiberWire.  I passed using a [place] suture passer through the inner core of the screw forming a figure-of-eight pattern.  The suture was tightened.  The repair was excellent.  No gapping.  Tourniquet was let down.  Meticulous hemostasis was achieved. I closed the retinaculum with #1 Vicryl.  Deep dermal layer was closed with 2-0 interrupted Vicryl.  Skin was closed with 2-0 nylon using mattress technique.  Aquacel dressing was applied followed by compressive Ace wrap and a knee  immobilizer.  The patient was extubated and taken to the PACU in stable condition.  Sponge, needle, and instrument counts were correct at the end of the case x2.  There were no known complications.  Postoperatively, she may weightbear as tolerated.  She will wear the knee immobilizer at all times.  She was not to bend her knee.  I will see her back in the office 2 weeks after discharge.  We will place her on aspirin for DVT prophylaxis.          ______________________________ Samson Frederic, MD     BS/MEDQ  D:  05/07/2017  T:  05/07/2017  Job:  [id number]

## 2017-05-10 ENCOUNTER — Encounter (HOSPITAL_COMMUNITY): Payer: Self-pay | Admitting: Orthopedic Surgery

## 2017-10-26 DIAGNOSIS — S82002A Unspecified fracture of left patella, initial encounter for closed fracture: Secondary | ICD-10-CM | POA: Insufficient documentation

## 2019-09-04 ENCOUNTER — Ambulatory Visit: Payer: BC Managed Care – PPO | Admitting: Cardiology

## 2019-09-18 ENCOUNTER — Encounter: Payer: Self-pay | Admitting: General Practice

## 2019-10-31 ENCOUNTER — Other Ambulatory Visit: Payer: Self-pay

## 2019-10-31 ENCOUNTER — Encounter (INDEPENDENT_AMBULATORY_CARE_PROVIDER_SITE_OTHER): Payer: Self-pay

## 2019-10-31 ENCOUNTER — Encounter: Payer: Self-pay | Admitting: Cardiology

## 2019-10-31 ENCOUNTER — Ambulatory Visit: Payer: Medicare PPO | Admitting: Cardiology

## 2019-10-31 DIAGNOSIS — Z8249 Family history of ischemic heart disease and other diseases of the circulatory system: Secondary | ICD-10-CM | POA: Diagnosis not present

## 2019-10-31 DIAGNOSIS — Z7189 Other specified counseling: Secondary | ICD-10-CM | POA: Diagnosis not present

## 2019-10-31 DIAGNOSIS — R9431 Abnormal electrocardiogram [ECG] [EKG]: Secondary | ICD-10-CM

## 2019-10-31 NOTE — Progress Notes (Signed)
Cardiology Office Note:    Date:  10/31/2019   ID:  Alejandra Thompson, DOB 09/21/1951, MRN [id number]  PCP:  Shirlean Mylar, MD  Cardiologist:  Jodelle Red, MD  Referring MD: Shirlean Mylar, MD   CC: new patient evaluation for abnormal ECG  History of Present Illness:    Alejandra Thompson is a 69 y.o. female with a hx of benign thrombocytopenia, hypercholesterolemia, allergies who is seen as a new consult at the request of Shirlean Mylar, MD for the evaluation and management of abnormal ECG.  Note received and reviewed from recent visit with Dr. Hyman Hopes, dated 06/09/2019. This was her annual wellness exam. Labs reviewed via KPN and office note, notable for A1c 5.8, Lipids with Tchol 188, TG 106, HDL 58, LDL 109. CMP notable only for glucose of 103, otherwise WNL. ECG noted Sinus rhythm with diffuse T wave inversions and ST depressions. I do not have a prior for comparison.  Today: Medicare wellness visit in 05/2019, had ECG done which was abnormal. When she went home, her husband said she had been told in the past that her ECG was abnormal. She was told in 2018 that her telemetry pattern was abnormal but nothing to worry about.   Cardiovascular risk factors: Prior clinical ASCVD: none Comorbid conditions: Denies hypertension, diabetes, chronic kidney disease. Has been diagnosed with high cholesterol, has been on/off statin for the last several years.  Metabolic syndrome/Obesity: BMI 27 Chronic inflammatory conditions: none Tobacco use history: former, quit initially 30 years ago, then a closet smoker for a time, but none in at least 10 years Family history: father died in his 50s of a massive MI (39). All of her siblings are healthy. Lost her mother to sarcoid. Prior cardiac testing and/or incidental findings on other testing (ie coronary calcium): none Exercise level: does walking occasionally with a friend, walks more when the weather is better. No limitations due to symptoms. Current diet: watches  carbs, tries to eat a balanced diet. Does a variation of intermittent fasting.   Denies chest pain, shortness of breath at rest or with normal exertion. No PND, orthopnea, LE edema or unexpected weight gain. No syncope or palpitations.  Past Medical History:  Diagnosis Date  . Hyperlipemia   . Platelets decreased (HCC)    pt states her platelets are "low to normal"  . Seasonal allergies   . UTI (urinary tract infection)     Past Surgical History:  Procedure Laterality Date  . COLONOSCOPY    . ORIF PATELLA Left 05/07/2017   Procedure: OPEN REDUCTION INTERNAL (ORIF) FIXATION LEFT PATELLA;  Surgeon: Samson Frederic, MD;  Location: [facility];  Service: Orthopedics;  Laterality: Left;    Current Medications: Current Outpatient Medications on File Prior to Visit  Medication Sig  . aspirin EC 325 MG tablet Take 1 tablet (325 mg total) by mouth 2 (two) times daily after a meal.  . docusate sodium (COLACE) 100 MG capsule Take 1 capsule (100 mg total) by mouth 2 (two) times daily.  . montelukast (SINGULAIR) 10 MG tablet Take 10 mg by mouth at bedtime.   . nitrofurantoin (MACRODANTIN) 100 MG capsule Take 100 mg by mouth at bedtime.   . ondansetron (ZOFRAN) 4 MG tablet Take 1 tablet (4 mg total) by mouth every 8 (eight) hours as needed for nausea or vomiting.  Marland Kitchen oxyCODONE-acetaminophen (ROXICET) 5-325 MG tablet Take 1-2 tablets by mouth every 4 (four) hours as needed for severe pain.  Marland Kitchen senna (SENOKOT) 8.6 MG TABS tablet Take  2 tablets (17.2 mg total) by mouth at bedtime.  . simvastatin (ZOCOR) 20 MG tablet Take 20 mg by mouth every evening.   . zolpidem (AMBIEN) 5 MG tablet Take 5 mg by mouth at bedtime as needed for sleep.   No current facility-administered medications on file prior to visit.     Allergies:   No known allergies   Social History   Tobacco Use  . Smoking status: Former Smoker    Quit date: 05/07/2007    Years since quitting: 12.4  . Smokeless tobacco: Never Used  Substance  Use Topics  . Alcohol use: Yes    Comment: social  . Drug use: No    Family History: family history includes Heart attack in her father; Scleroderma in her mother.  ROS:   Please see the history of present illness.  Additional pertinent ROS: Constitutional: Negative for chills, fever, night sweats, unintentional weight loss  HENT: Negative for ear pain and hearing loss.   Eyes: Negative for loss of vision and eye pain.  Respiratory: Negative for cough, sputum, wheezing.   Cardiovascular: See HPI. Gastrointestinal: Negative for abdominal pain, melena, and hematochezia.  Genitourinary: Negative for dysuria and hematuria.  Musculoskeletal: Negative for falls and myalgias.  Skin: Negative for itching and rash.  Neurological: Negative for focal weakness, focal sensory changes and loss of consciousness.  Endo/Heme/Allergies: Does not bruise/bleed easily.     EKGs/Labs/Other Studies Reviewed:    The following studies were reviewed today: No prior  EKG:  EKG is personally reviewed.  The ekg ordered today demonstrates NSR with diffuse T wave inversions, similar to PCP ECG  Recent Labs: No results found for requested labs within last 8760 hours.  Recent Lipid Panel No results found for: CHOL, TRIG, HDL, CHOLHDL, VLDL, LDLCALC, LDLDIRECT  Physical Exam:    VS:  BP 121/65   Pulse 68   Ht 5\' 7"  (1.702 m)   Wt 175 lb 12.8 oz (79.7 kg)   SpO2 97%   BMI 27.53 kg/m     Wt Readings from Last 3 Encounters:  05/07/17 170 lb (77.1 kg)  04/29/17 170 lb (77.1 kg)    GEN: Well nourished, well developed in no acute distress HEENT: Normal, moist mucous membranes NECK: No JVD CARDIAC: regular rhythm, normal S1 and S2, no rubs or gallops. No murmurs. VASCULAR: Radial and DP pulses 2+ bilaterally. No carotid bruits RESPIRATORY:  Clear to auscultation without rales, wheezing or rhonchi  ABDOMEN: Soft, non-tender, non-distended MUSCULOSKELETAL:  Ambulates independently SKIN: Warm and dry,  no edema NEUROLOGIC:  Alert and oriented x 3. No focal neuro deficits noted. PSYCHIATRIC:  Normal affect    ASSESSMENT:    1. Nonspecific abnormal electrocardiogram (ECG) (EKG)   2. Family history of heart disease   3. Cardiac risk counseling   4. Counseling on health promotion and disease prevention    PLAN:    Abnormal ECG: asymptomatic. There is diffuse t wave inversion. The only prior ECG I have is from PCP office, which is similar -will order echocardiogram to rule out reduced EF or structural heart disease -instructed on red flag warning signs that need immediate medical attention  Family history of heart disease: discussed prevention recommendations today -no longer on aspirin, which is acceptable, discussed guidelines -currently on simvastatin, tolerating  Cardiac risk counseling and prevention recommendations: -recommend heart healthy/Mediterranean diet, with whole grains, fruits, vegetable, fish, lean meats, nuts, and olive oil. Limit salt. -recommend moderate walking, 3-5 times/week for 30-50 minutes each session.  Aim for at least 150 minutes.week. Goal should be pace of 3 miles/hours, or walking 1.5 miles in 30 minutes -recommend avoidance of tobacco products. Avoid excess alcohol. -Additional risk factor control:  -Diabetes risk: A1c is 5.8, no diagnosis of diabetes  -Lipids: noted per KPN in HPI, on statin  -Blood pressure control: well controlled, on no medications  -Weight: BMI 27  Plan for follow up: 1 year if echo normal, sooner as needed  Jodelle Red, MD, PhD [place]  [facility]    Medication Adjustments/Labs and Tests Ordered: Current medicines are reviewed at length with the patient today.  Concerns regarding medicines are outlined above.  Orders Placed This Encounter  Procedures  . EKG 12-Lead  . ECHOCARDIOGRAM COMPLETE   No orders of the defined types were placed in this encounter.   Patient Instructions  Medication  Instructions:  Your Physician recommend you continue on your current medication as directed.    *If you need a refill on your cardiac medications before your next appointment, please call your pharmacy*  Lab Work: None  Testing/Procedures: Your physician has requested that you have an echocardiogram. Echocardiography is a painless test that uses sound waves to create images of your heart. It provides your doctor with information about the size and shape of your heart and how well your heart's chambers and valves are working. This procedure takes approximately one hour. There are no restrictions for this procedure. [address]   Follow-Up: At [facility], you and your health needs are our priority.  As part of our continuing mission to provide you with exceptional heart care, we have created designated Provider Care Teams.  These Care Teams include your primary Cardiologist (physician) and Advanced Practice Providers (APPs -  Physician Assistants and Nurse Practitioners) who all work together to provide you with the care you need, when you need it.  Your next appointment:   1 year(s)  The format for your next appointment:   In Person  Provider:   Jodelle Red, MD     Signed, Jodelle Red, MD PhD 10/31/2019    [facility]

## 2019-10-31 NOTE — Patient Instructions (Signed)
Medication Instructions:  Your Physician recommend you continue on your current medication as directed.    *If you need a refill on your cardiac medications before your next appointment, please call your pharmacy*  Lab Work: None  Testing/Procedures: Your physician has requested that you have an echocardiogram. Echocardiography is a painless test that uses sound waves to create images of your heart. It provides your doctor with information about the size and shape of your heart and how well your heart's chambers and valves are working. This procedure takes approximately one hour. There are no restrictions for this procedure. [address]   Follow-Up: At [facility], you and your health needs are our priority.  As part of our continuing mission to provide you with exceptional heart care, we have created designated Provider Care Teams.  These Care Teams include your primary Cardiologist (physician) and Advanced Practice Providers (APPs -  Physician Assistants and Nurse Practitioners) who all work together to provide you with the care you need, when you need it.  Your next appointment:   1 year(s)  The format for your next appointment:   In Person  Provider:   Bridgette Christopher, MD   

## 2019-11-13 ENCOUNTER — Other Ambulatory Visit (HOSPITAL_COMMUNITY): Payer: Medicare PPO

## 2019-11-20 ENCOUNTER — Ambulatory Visit (HOSPITAL_COMMUNITY): Payer: Medicare PPO | Attending: Cardiology

## 2019-11-20 ENCOUNTER — Other Ambulatory Visit: Payer: Self-pay

## 2019-11-20 DIAGNOSIS — R9431 Abnormal electrocardiogram [ECG] [EKG]: Secondary | ICD-10-CM | POA: Insufficient documentation

## 2019-11-23 ENCOUNTER — Telehealth: Payer: Self-pay | Admitting: Cardiology

## 2019-11-23 NOTE — Telephone Encounter (Signed)
Patient returning Alisha's call in regards to results.

## 2019-11-24 NOTE — Telephone Encounter (Signed)
Pt updated and verbalized understanding.  

## 2019-12-28 ENCOUNTER — Encounter: Payer: Self-pay | Admitting: Cardiology

## 2019-12-28 DIAGNOSIS — R9431 Abnormal electrocardiogram [ECG] [EKG]: Secondary | ICD-10-CM | POA: Insufficient documentation

## 2019-12-28 DIAGNOSIS — Z8249 Family history of ischemic heart disease and other diseases of the circulatory system: Secondary | ICD-10-CM | POA: Insufficient documentation

## 2020-12-03 DIAGNOSIS — Z1231 Encounter for screening mammogram for malignant neoplasm of breast: Secondary | ICD-10-CM | POA: Diagnosis not present

## 2021-06-07 DIAGNOSIS — S61012A Laceration without foreign body of left thumb without damage to nail, initial encounter: Secondary | ICD-10-CM | POA: Diagnosis not present

## 2021-07-03 DIAGNOSIS — Z86010 Personal history of colonic polyps: Secondary | ICD-10-CM | POA: Diagnosis not present

## 2021-07-03 DIAGNOSIS — Z0000 Encounter for general adult medical examination without abnormal findings: Secondary | ICD-10-CM | POA: Diagnosis not present

## 2021-07-03 DIAGNOSIS — G4700 Insomnia, unspecified: Secondary | ICD-10-CM | POA: Diagnosis not present

## 2021-07-03 DIAGNOSIS — J309 Allergic rhinitis, unspecified: Secondary | ICD-10-CM | POA: Diagnosis not present

## 2021-07-03 DIAGNOSIS — M8580 Other specified disorders of bone density and structure, unspecified site: Secondary | ICD-10-CM | POA: Diagnosis not present

## 2021-07-03 DIAGNOSIS — R7303 Prediabetes: Secondary | ICD-10-CM | POA: Diagnosis not present

## 2021-07-03 DIAGNOSIS — Z23 Encounter for immunization: Secondary | ICD-10-CM | POA: Diagnosis not present

## 2021-07-03 DIAGNOSIS — Z1159 Encounter for screening for other viral diseases: Secondary | ICD-10-CM | POA: Diagnosis not present

## 2021-07-03 DIAGNOSIS — E2839 Other primary ovarian failure: Secondary | ICD-10-CM | POA: Diagnosis not present

## 2021-07-16 DIAGNOSIS — Z780 Asymptomatic menopausal state: Secondary | ICD-10-CM | POA: Diagnosis not present

## 2021-07-16 DIAGNOSIS — M85851 Other specified disorders of bone density and structure, right thigh: Secondary | ICD-10-CM | POA: Diagnosis not present

## 2021-07-16 DIAGNOSIS — M85852 Other specified disorders of bone density and structure, left thigh: Secondary | ICD-10-CM | POA: Diagnosis not present

## 2021-08-08 ENCOUNTER — Other Ambulatory Visit: Payer: Self-pay

## 2021-08-08 ENCOUNTER — Ambulatory Visit (HOSPITAL_BASED_OUTPATIENT_CLINIC_OR_DEPARTMENT_OTHER): Payer: Medicare PPO | Admitting: Cardiology

## 2021-08-08 ENCOUNTER — Encounter (HOSPITAL_BASED_OUTPATIENT_CLINIC_OR_DEPARTMENT_OTHER): Payer: Self-pay | Admitting: Cardiology

## 2021-08-08 VITALS — BP 140/86 | HR 73 | Ht 67.0 in | Wt 173.2 lb

## 2021-08-08 DIAGNOSIS — R9431 Abnormal electrocardiogram [ECG] [EKG]: Secondary | ICD-10-CM

## 2021-08-08 DIAGNOSIS — R072 Precordial pain: Secondary | ICD-10-CM

## 2021-08-08 DIAGNOSIS — Z8249 Family history of ischemic heart disease and other diseases of the circulatory system: Secondary | ICD-10-CM

## 2021-08-08 DIAGNOSIS — Z7189 Other specified counseling: Secondary | ICD-10-CM

## 2021-08-08 MED ORDER — METOPROLOL TARTRATE 50 MG PO TABS: ORAL_TABLET | ORAL | 0 refills | Status: DC

## 2021-08-08 MED ORDER — METOPROLOL TARTRATE 50 MG PO TABS: 50.0000 mg | ORAL_TABLET | Freq: Once | ORAL | 0 refills | Status: DC

## 2021-08-08 NOTE — Progress Notes (Signed)
Cardiology Office Note:    Date:  08/08/2021   ID:  Alejandra Thompson, DOB 1951/06/25, MRN [id number]  PCP:  Jonathon Jordan, MD  Cardiologist:  Buford Dresser, MD  CC: Follow-up  History of Present Illness:    Alejandra Thompson is a 70 y.o. female with a hx of benign thrombocytopenia, hypercholesterolemia, allergies who is seen for follow-up. I initially met her 10/31/2019 as a new consult for the evaluation and management of abnormal ECG.  Today: Overall, she is feeling good. Lately she believes she has developed acid reflux. She initially felt mild chest discomfort while walking, which concerned her. Since then the chest discomfort has been accompanied by belching, and by the time she finishes her walk the pain subsides. This has mostly occurred with activity, and not while lying down at night. For treatment she has tried Tums, but she does not know if this was helpful or the pain resolved spontaneously.  She denies any palpitations, or shortness of breath. No lightheadedness, headaches, syncope, orthopnea, PND, or lower extremity edema.  Past Medical History:  Diagnosis Date   Hyperlipemia    Platelets decreased ([place])    pt states her platelets are "low to normal"   Seasonal allergies    UTI (urinary tract infection)     Past Surgical History:  Procedure Laterality Date   COLONOSCOPY     ORIF PATELLA Left 05/07/2017   Procedure: OPEN REDUCTION INTERNAL (ORIF) FIXATION LEFT PATELLA;  Surgeon: Rod Can, MD;  Location: [place];  Service: Orthopedics;  Laterality: Left;    Current Medications: Current Outpatient Medications on File Prior to Visit  Medication Sig   Cholecalciferol (VITAMIN D3) 25 MCG (1000 UT) CAPS 1 capsule   montelukast (SINGULAIR) 10 MG tablet Take 10 mg by mouth at bedtime.    nitrofurantoin (MACRODANTIN) 100 MG capsule Take 100 mg by mouth at bedtime.    simvastatin (ZOCOR) 20 MG tablet Take 20 mg by mouth every evening.    zolpidem (AMBIEN) 5 MG tablet  Take 5 mg by mouth at bedtime as needed for sleep.   No current facility-administered medications on file prior to visit.     Allergies:   No known allergies   Social History   Tobacco Use   Smoking status: Former    Types: Cigarettes    Quit date: 05/07/2007    Years since quitting: 14.2   Smokeless tobacco: Never  Vaping Use   Vaping Use: Never used  Substance Use Topics   Alcohol use: Yes    Comment: social   Drug use: No    Family History: family history includes Heart attack in her father; Scleroderma in her mother. father died in his 84s of a massive MI (1956). All of her siblings are healthy. Lost her mother to sarcoid.  ROS:   Please see the history of present illness. (+) Chest discomfort (+) Acid reflux (+) Belching All other systems are reviewed and negative.    EKGs/Labs/Other Studies Reviewed:    The following studies were reviewed today:  Echo 11/20/2019:  1. Left ventricular ejection fraction, by estimation, is 65 to 70%. The  left ventricle has hyperdynamic function. The left ventricle has no  regional wall motion abnormalities. There is mild left ventricular  hypertrophy. Left ventricular diastolic  parameters were normal.   2. Right ventricular systolic function is normal. The right ventricular  size is normal. Tricuspid regurgitation signal is inadequate for assessing  PA pressure.   3. The mitral valve is normal in  structure and function. No evidence of  mitral valve regurgitation.   4. The aortic valve is tricuspid. Aortic valve regurgitation is not  visualized. Mild aortic valve sclerosis is present, with no evidence of  aortic valve stenosis.   5. The inferior vena cava is normal in size with greater than 50%  respiratory variability, suggesting right atrial pressure of 3 mmHg.  EKG:  EKG is personally reviewed.   08/08/2021: NSR at 73 bpm with nonspecific ST-T pattern 10/31/2019: NSR with diffuse T wave inversions, similar to PCP  ECG  Recent Labs: No results found for requested labs within last 8760 hours.   Recent Lipid Panel No results found for: CHOL, TRIG, HDL, CHOLHDL, VLDL, LDLCALC, LDLDIRECT  Physical Exam:    VS:  BP 140/86   Pulse 73   Ht _0  (1.702 m)   Wt 173 lb 3.2 oz (78.6 kg)   SpO2 97%   BMI 27.13 kg/m     Wt Readings from Last 3 Encounters:  08/08/21 173 lb 3.2 oz (78.6 kg)  10/31/19 175 lb 12.8 oz (79.7 kg)  05/07/17 170 lb (77.1 kg)    GEN: Well nourished, well developed in no acute distress HEENT: Normal, moist mucous membranes NECK: No JVD CARDIAC: regular rhythm, normal S1 and S2, no rubs or gallops. No murmurs. VASCULAR: Radial and DP pulses 2+ bilaterally. No carotid bruits RESPIRATORY:  Clear to auscultation without rales, wheezing or rhonchi  ABDOMEN: Soft, non-tender, non-distended MUSCULOSKELETAL:  Ambulates independently SKIN: Warm and dry, no edema NEUROLOGIC:  Alert and oriented x 3. No focal neuro deficits noted. PSYCHIATRIC:  Normal affect    ASSESSMENT:    1. Precordial pain   2. Nonspecific abnormal electrocardiogram (ECG) (EKG)   3. Family history of heart disease   4. Cardiac risk counseling   5. Counseling on health promotion and disease prevention     PLAN:    Chest pain: -discussed treadmill stress, nuclear stress/lexiscan, and CT coronary angiography. Discussed pros and cons of each, including but not limited to false positive/false negative risk, radiation risk, and risk of IV contrast dye. Based on shared decision making, decision was made to pursue CT coronary angiography. -will give one time dose of metoprolol 2 hours prior to scheduled test -counseled on need to get BMET prior to test -counseled on use of sublingual nitroglycerin and its importance to a good test  NOTE UPDATED: calcium score 0.3, small mixed plaque in proximal LAD without stenosis, no other noted disease. Suggests chest pain not cardiac in etiology  Abnormal ECG: asymptomatic.  Normal echo  Family history of heart disease: discussed prevention recommendations today -no longer on aspirin, which is acceptable, discussed guidelines -currently on simvastatin, tolerating  Cardiac risk counseling and prevention recommendations: -recommend heart healthy/Mediterranean diet, with whole grains, fruits, vegetable, fish, lean meats, nuts, and olive oil. Limit salt. -recommend moderate walking, 3-5 times/week for 30-50 minutes each session. Aim for at least 150 minutes.week. Goal should be pace of 3 miles/hours, or walking 1.5 miles in 30 minutes -recommend avoidance of tobacco products. Avoid excess alcohol. -Additional risk factor control:  -Diabetes risk: A1c is 5.8, no diagnosis of diabetes  -Lipids: noted per KPN in HPI, on statin  -Blood pressure control: well controlled, on no medications  -Weight: BMI 27  Plan for follow up: 1 year or sooner as needed  Buford Dresser, MD, PhD [place]  [facility]    Medication Adjustments/Labs and Tests Ordered: Current medicines are reviewed at length with  the patient today.  Concerns regarding medicines are outlined above.   Orders Placed This Encounter  Procedures   CT CORONARY MORPH W/CTA COR W/SCORE W/CA W/CM &/OR WO/CM   Basic Metabolic Panel (BMET)   EKG 12-Lead    Meds ordered this encounter  Medications   DISCONTD: metoprolol tartrate (LOPRESSOR) 50 MG tablet    Sig: Take 1 tablet (50 mg total) by mouth once for 1 dose. Hour prior to CTA    Dispense:  1 tablet    Refill:  0   metoprolol tartrate (LOPRESSOR) 50 MG tablet    Sig: Take 1 tablet by mouth 2 hours prior to test    Dispense:  1 tablet    Refill:  0    Patient Instructions  Medication Instructions:  Your Physician recommend you continue on your current medication as directed.    TAKE- Metoprolol Tartrate 50 mg by mouth 2 hours prior to CTA  *If you need a refill on your cardiac medications before your next appointment, please call  your pharmacy*   Lab Work: Your provider has recommended lab work today (BMP). Please have this collected at [facility] [place]. The lab is open 8:00 am - 4:30 pm. Please avoid 12:00p - 1:00p for lunch hour. You do not need an appointment. Please go to [address] [place], [place] [address]. This is in the Primary Care office on the 3rd floor, let them know you are there for blood work and they will direct you to the lab.   If you have labs (blood work) drawn today and your tests are completely normal, you will receive your results only by: [place] (if you have MyChart) OR A paper copy in the mail If you have any lab test that is abnormal or we need to change your treatment, we will call you to review the results.   Testing/Procedures: Cardiac CT Angiography (CTA), is a special type of CT scan that uses a computer to produce multi-dimensional views of major blood vessels throughout the body. In CT angiography, a contrast material is injected through an IV to help visualize the blood vessels [facility]   Follow-Up: At [facility], you and your health needs are our priority.  As part of our continuing mission to provide you with exceptional heart care, we have created designated Provider Care Teams.  These Care Teams include your primary Cardiologist (physician) and Advanced Practice Providers (APPs -  Physician Assistants and Nurse Practitioners) who all work together to provide you with the care you need, when you need it.  We recommend signing up for the patient portal called "MyChart".  Sign up information is provided on this After Visit Summary.  MyChart is used to connect with patients for Virtual Visits (Telemedicine).  Patients are able to view lab/test results, encounter notes, upcoming appointments, etc.  Non-urgent messages can be sent to your provider as well.   To learn more about what you can do with MyChart, go to [email or web address].     Your next appointment:   1 year(s)  The format for your next appointment:   In Person  Provider:   Buford Dresser, MD    Other Instructions   Your cardiac CT will be scheduled at one of the below locations:   [facility] [address] [place], [place] [address] [phone]  If scheduled at [facility], please arrive at the [facility] main entrance (entrance A) of [facility]  30 minutes prior to test start time. You can use the FREE valet parking offered at the main entrance (encouraged to control the heart rate for the test) Proceed to the [facility] Radiology Department (first floor) to check-in and test prep.  If scheduled at [facility], please arrive 15 mins early for check-in and test prep.  Please follow these instructions carefully (unless otherwise directed):   On the Night Before the Test: Be sure to Drink plenty of water. Do not consume any caffeinated/decaffeinated beverages or chocolate 12 hours prior to your test. Do not take any antihistamines 12 hours prior to your test.   On the Day of the Test: Drink plenty of water until 1 hour prior to the test. Do not eat any food 4 hours prior to the test. You may take your regular medications prior to the test.  Take metoprolol (Lopressor) 50 mg two hours prior to test. FEMALES- please wear underwire-free bra if available, avoid dresses & tight clothing      After the Test: Drink plenty of water. After receiving IV contrast, you may experience a mild flushed feeling. This is normal. On occasion, you may experience a mild rash up to 24 hours after the test. This is not dangerous. If this occurs, you can take Benadryl 25 mg and increase your fluid intake. If you experience trouble breathing, this can be serious. If it is severe call 911 IMMEDIATELY. If it is mild, please call our office. If you take any of these medications: Glipizide/Metformin,  Avandament, Glucavance, please do not take 48 hours after completing test unless otherwise instructed.  Please allow 2-4 weeks for scheduling of routine cardiac CTs. Some insurance companies require a pre-authorization which may delay scheduling of this test.   For non-scheduling related questions, please contact the cardiac imaging nurse navigator should you have any questions/concerns: Marchia Bond, Cardiac Imaging Nurse Navigator Gordy Clement, Cardiac Imaging Nurse Navigator [facility] Direct Office Dial: [phone]   For scheduling needs, including cancellations and rescheduling, please call [place], [phone].     I,Mathew Stumpf,acting as a Education administrator for [employer], MD.,have documented all relevant documentation on the behalf of Buford Dresser, MD,as directed by  Buford Dresser, MD while in the presence of Buford Dresser, MD.  I, Buford Dresser, MD, have reviewed all documentation for this visit. The documentation on 10/07/21 for the exam, diagnosis, procedures, and orders are all accurate and complete.   Signed, Buford Dresser, MD PhD 08/08/2021    [place]

## 2021-08-08 NOTE — Patient Instructions (Addendum)
Medication Instructions:  Your Physician recommend you continue on your current medication as directed.    TAKE- Metoprolol Tartrate 50 mg by mouth 2 hours prior to CTA  *If you need a refill on your cardiac medications before your next appointment, please call your pharmacy*   Lab Work: Your provider has recommended lab work today (BMP). Please have this collected at [facility]. The lab is open 8:00 am - 4:30 pm. Please avoid 12:00p - 1:00p for lunch hour. You do not need an appointment. Please go to [address] [place], [place] [address]. This is in the Primary Care office on the 3rd floor, let them know you are there for blood work and they will direct you to the lab.   If you have labs (blood work) drawn today and your tests are completely normal, you will receive your results only by: MyChart Message (if you have MyChart) OR A paper copy in the mail If you have any lab test that is abnormal or we need to change your treatment, we will call you to review the results.   Testing/Procedures: Cardiac CT Angiography (CTA), is a special type of CT scan that uses a computer to produce multi-dimensional views of major blood vessels throughout the body. In CT angiography, a contrast material is injected through an IV to help visualize the blood vessels [facility]   Follow-Up: At [facility], you and your health needs are our priority.  As part of our continuing mission to provide you with exceptional heart care, we have created designated Provider Care Teams.  These Care Teams include your primary Cardiologist (physician) and Advanced Practice Providers (APPs -  Physician Assistants and Nurse Practitioners) who all work together to provide you with the care you need, when you need it.  We recommend signing up for the patient portal called "MyChart".  Sign up information is provided on this After Visit Summary.  MyChart is used to connect with patients  for Virtual Visits (Telemedicine).  Patients are able to view lab/test results, encounter notes, upcoming appointments, etc.  Non-urgent messages can be sent to your provider as well.   To learn more about what you can do with MyChart, go to [email or web address].    Your next appointment:   1 year(s)  The format for your next appointment:   In Person  Provider:   Jodelle Red, MD    Other Instructions   Your cardiac CT will be scheduled at one of the below locations:   [facility] [address] [place], [place] [address] [phone]  If scheduled at [facility], please arrive at the [facility] main entrance (entrance A) of [facility] 30 minutes prior to test start time. You can use the FREE valet parking offered at the main entrance (encouraged to control the heart rate for the test) Proceed to the [facility] Radiology Department (first floor) to check-in and test prep.  If scheduled at [facility], please arrive 15 mins early for check-in and test prep.  Please follow these instructions carefully (unless otherwise directed):   On the Night Before the Test: Be sure to Drink plenty of water. Do not consume any caffeinated/decaffeinated beverages or chocolate 12 hours prior to your test. Do not take any antihistamines 12 hours prior to your test.   On the Day of the Test: Drink plenty of water until 1 hour prior to the test. Do not eat any food  4 hours prior to the test. You may take your regular medications prior to the test.  Take metoprolol (Lopressor) 50 mg two hours prior to test. FEMALES- please wear underwire-free bra if available, avoid dresses & tight clothing      After the Test: Drink plenty of water. After receiving IV contrast, you may experience a mild flushed feeling. This is normal. On occasion, you may experience a mild rash up to 24 hours after the test. This is not dangerous. If  this occurs, you can take Benadryl 25 mg and increase your fluid intake. If you experience trouble breathing, this can be serious. If it is severe call 911 IMMEDIATELY. If it is mild, please call our office. If you take any of these medications: Glipizide/Metformin, Avandament, Glucavance, please do not take 48 hours after completing test unless otherwise instructed.  Please allow 2-4 weeks for scheduling of routine cardiac CTs. Some insurance companies require a pre-authorization which may delay scheduling of this test.   For non-scheduling related questions, please contact the cardiac imaging nurse navigator should you have any questions/concerns: Rockwell Alexandria, Cardiac Imaging Nurse Navigator Larey Brick, Cardiac Imaging Nurse Navigator [facility] Direct Office Dial: [phone]   For scheduling needs, including cancellations and rescheduling, please call [place], [phone].

## 2021-08-09 LAB — BASIC METABOLIC PANEL
BUN/Creatinine Ratio: 15 (ref 12–28)
BUN: 14 mg/dL (ref 8–27)
CO2: 25 mmol/L (ref 20–29)
Calcium: 9.3 mg/dL (ref 8.7–10.3)
Chloride: 102 mmol/L (ref 96–106)
Creatinine, Ser: 0.92 mg/dL (ref 0.57–1.00)
Glucose: 102 mg/dL — ABNORMAL HIGH (ref 70–99)
Potassium: 4.3 mmol/L (ref 3.5–5.2)
Sodium: 141 mmol/L (ref 134–144)
eGFR: 68 mL/min/{1.73_m2} (ref >59)

## 2021-08-22 ENCOUNTER — Telehealth (HOSPITAL_COMMUNITY): Payer: Self-pay | Admitting: *Deleted

## 2021-08-22 NOTE — Telephone Encounter (Signed)
Reaching out to patient to offer assistance regarding upcoming cardiac imaging study; pt verbalizes understanding of appt date/time, parking situation and where to check in, pre-test NPO status and medications ordered, and verified current allergies; name and call back number provided for further questions should they arise  Larey Brick RN Navigator Cardiac Imaging [facility] [phone] office [phone] cell  Patient is to take 50mg  metoprolol tartrate two hours prior to cardiac CT.  She is aware to arrive at 8:30am for her 9am scan.

## 2021-08-25 ENCOUNTER — Other Ambulatory Visit: Payer: Self-pay

## 2021-08-25 ENCOUNTER — Encounter (HOSPITAL_COMMUNITY): Payer: Self-pay

## 2021-08-25 ENCOUNTER — Ambulatory Visit (HOSPITAL_COMMUNITY)
Admission: RE | Admit: 2021-08-25 | Discharge: 2021-08-25 | Disposition: A | Discharge status: Home / Self Care | Payer: Medicare PPO | Source: Ambulatory Visit | Attending: Cardiology | Admitting: Cardiology

## 2021-08-25 DIAGNOSIS — R072 Precordial pain: Secondary | ICD-10-CM | POA: Insufficient documentation

## 2021-08-25 MED ORDER — IOHEXOL 350 MG/ML SOLN
95.0000 mL | Freq: Once | INTRAVENOUS | Status: AC | PRN
  Administered 2021-08-25: 95 mL via INTRAVENOUS

## 2021-08-25 MED ORDER — NITROGLYCERIN 0.4 MG SL SUBL
SUBLINGUAL_TABLET | SUBLINGUAL | Status: AC
  Filled 2021-08-25: qty 2

## 2021-08-25 MED ORDER — NITROGLYCERIN 0.4 MG SL SUBL
0.8000 mg | SUBLINGUAL_TABLET | Freq: Once | SUBLINGUAL | Status: AC
  Administered 2021-08-25: 0.8 mg via SUBLINGUAL

## 2021-11-05 DIAGNOSIS — Z23 Encounter for immunization: Secondary | ICD-10-CM | POA: Diagnosis not present

## 2021-11-05 DIAGNOSIS — L821 Other seborrheic keratosis: Secondary | ICD-10-CM | POA: Diagnosis not present

## 2021-12-05 DIAGNOSIS — R350 Frequency of micturition: Secondary | ICD-10-CM | POA: Diagnosis not present

## 2021-12-05 DIAGNOSIS — R109 Unspecified abdominal pain: Secondary | ICD-10-CM | POA: Diagnosis not present

## 2021-12-05 DIAGNOSIS — N3020 Other chronic cystitis without hematuria: Secondary | ICD-10-CM | POA: Diagnosis not present

## 2021-12-18 DIAGNOSIS — Z1231 Encounter for screening mammogram for malignant neoplasm of breast: Secondary | ICD-10-CM | POA: Diagnosis not present

## 2022-04-08 DIAGNOSIS — K635 Polyp of colon: Secondary | ICD-10-CM | POA: Diagnosis not present

## 2022-04-08 DIAGNOSIS — Z86010 Personal history of colonic polyps: Secondary | ICD-10-CM | POA: Diagnosis not present

## 2022-04-08 DIAGNOSIS — Z09 Encounter for follow-up examination after completed treatment for conditions other than malignant neoplasm: Secondary | ICD-10-CM | POA: Diagnosis not present

## 2022-04-10 DIAGNOSIS — K635 Polyp of colon: Secondary | ICD-10-CM | POA: Diagnosis not present

## 2022-05-18 DIAGNOSIS — H2513 Age-related nuclear cataract, bilateral: Secondary | ICD-10-CM | POA: Diagnosis not present

## 2022-07-08 DIAGNOSIS — Z23 Encounter for immunization: Secondary | ICD-10-CM | POA: Diagnosis not present

## 2022-07-08 DIAGNOSIS — J309 Allergic rhinitis, unspecified: Secondary | ICD-10-CM | POA: Diagnosis not present

## 2022-07-08 DIAGNOSIS — Z0000 Encounter for general adult medical examination without abnormal findings: Secondary | ICD-10-CM | POA: Diagnosis not present

## 2022-07-08 DIAGNOSIS — R7303 Prediabetes: Secondary | ICD-10-CM | POA: Diagnosis not present

## 2022-07-08 DIAGNOSIS — E785 Hyperlipidemia, unspecified: Secondary | ICD-10-CM | POA: Diagnosis not present

## 2022-07-08 DIAGNOSIS — E559 Vitamin D deficiency, unspecified: Secondary | ICD-10-CM | POA: Diagnosis not present

## 2022-07-08 DIAGNOSIS — G4700 Insomnia, unspecified: Secondary | ICD-10-CM | POA: Diagnosis not present

## 2022-10-07 NOTE — Progress Notes (Signed)
Cardiology Office Note:    Date:  10/08/2022   ID:  Symba Ramsdell, DOB 02-16-1951, MRN [id number]  PCP:  Jonathon Jordan, MD  Cardiologist:  Buford Dresser, MD  CC: Follow-up  History of Present Illness:    Alejandra Thompson is a 72 y.o. female with a hx of benign thrombocytopenia, hypercholesterolemia, allergies who is seen for follow-up. I initially met her 10/31/2019 as a new consult for the evaluation and management of abnormal ECG.  At her last appointment she complained of chest discomfort while walking, accompanied by belching. Mostly occurred with activity, and subsided by the time she finished her walk. For treatment she had tried Tums, but was unsure if helpful or the pain resolved spontaneously. We discussed treadmill stress, nuclear stress/lexiscan, and CT coronary angiography. Based on shared decision making, decision was made to pursue CT coronary angiography which revealed a coronary calcium score 0.3, small mixed plaque in proximal LAD without stenosis, no other noted disease. Suggestive of chest pain not cardiac in etiology.  Today, she states she is feeling well with no new cardiovascular complaints. Since her last visit her chest discomfort has recurred a couple times, with subsequent belching. However, this has not been frequent and she believes it may be more related to GI issues.  For activity she enjoys gardening and follows a lot of [employer] online classes/routines. Recently she also subbed for a preschool class.  She denies any palpitations, shortness of breath, or peripheral edema. No lightheadedness, headaches, syncope, orthopnea, or PND.   Past Medical History:  Diagnosis Date   Hyperlipemia    Platelets decreased ([place])    pt states her platelets are "low to normal"   Seasonal allergies    UTI (urinary tract infection)     Past Surgical History:  Procedure Laterality Date   COLONOSCOPY     ORIF PATELLA Left 05/07/2017   Procedure: OPEN REDUCTION  INTERNAL (ORIF) FIXATION LEFT PATELLA;  Surgeon: Rod Can, MD;  Location: [facility];  Service: Orthopedics;  Laterality: Left;    Current Medications: Current Outpatient Medications on File Prior to Visit  Medication Sig   Cholecalciferol (VITAMIN D3) 25 MCG (1000 UT) CAPS 1 capsule   montelukast (SINGULAIR) 10 MG tablet Take 10 mg by mouth at bedtime.    trimethoprim (TRIMPEX) 100 MG tablet Take 100 mg by mouth daily.   zolpidem (AMBIEN) 5 MG tablet Take 5 mg by mouth at bedtime as needed for sleep.   No current facility-administered medications on file prior to visit.     Allergies:   No known allergies   Social History   Tobacco Use   Smoking status: Former    Types: Cigarettes    Quit date: 05/07/2007    Years since quitting: 15.4   Smokeless tobacco: Never  Vaping Use   Vaping Use: Never used  Substance Use Topics   Alcohol use: Yes    Comment: social   Drug use: No    Family History: family history includes Heart attack in her father; Scleroderma in her mother. father died in his 38s of a massive MI (1956). All of her siblings are healthy. Lost her mother to sarcoid.  ROS:   Please see the history of present illness. (+) Rare chest discomfort with belching All other systems are reviewed and negative.    EKGs/Labs/Other Studies Reviewed:    The following studies were reviewed today:  Coronary CTA  08/25/2021: IMPRESSION: 1. Coronary artery calcium score 0.3 Agatston units. This places the patient  in the 45th percentile for age and gender, suggesting low risk for future cardiac events.   2.  No obstructive CAD.  Echo 11/20/2019:  1. Left ventricular ejection fraction, by estimation, is 65 to 70%. The  left ventricle has hyperdynamic function. The left ventricle has no  regional wall motion abnormalities. There is mild left ventricular  hypertrophy. Left ventricular diastolic  parameters were normal.   2. Right ventricular systolic function is normal. The  right ventricular  size is normal. Tricuspid regurgitation signal is inadequate for assessing  PA pressure.   3. The mitral valve is normal in structure and function. No evidence of  mitral valve regurgitation.   4. The aortic valve is tricuspid. Aortic valve regurgitation is not  visualized. Mild aortic valve sclerosis is present, with no evidence of  aortic valve stenosis.   5. The inferior vena cava is normal in size with greater than 50%  respiratory variability, suggesting right atrial pressure of 3 mmHg.  EKG:  EKG is personally reviewed.   10/08/2022:  NSR at 76 bpm with nonspecific ST-T pattern 08/08/2021: NSR at 73 bpm with nonspecific ST-T pattern 10/31/2019: NSR with diffuse T wave inversions, similar to PCP ECG  Recent Labs: No results found for requested labs within last 365 days.   Recent Lipid Panel No results found for: "CHOL", "TRIG", "HDL", "CHOLHDL", "VLDL", "LDLCALC", "LDLDIRECT"  Physical Exam:    VS:  BP 134/76 (BP Location: Right Arm, Patient Position: Sitting, Cuff Size: Normal)   Pulse 76   Ht 5' 7"$  (1.702 m)   Wt 176 lb 8 oz (80.1 kg)   BMI 27.64 kg/m     Wt Readings from Last 3 Encounters:  10/08/22 176 lb 8 oz (80.1 kg)  08/08/21 173 lb 3.2 oz (78.6 kg)  10/31/19 175 lb 12.8 oz (79.7 kg)    GEN: Well nourished, well developed in no acute distress HEENT: Normal, moist mucous membranes NECK: No JVD CARDIAC: regular rhythm, normal S1 and S2, no rubs or gallops. No murmurs. VASCULAR: Radial and DP pulses 2+ bilaterally. No carotid bruits RESPIRATORY:  Clear to auscultation without rales, wheezing or rhonchi  ABDOMEN: Soft, non-tender, non-distended MUSCULOSKELETAL:  Ambulates independently SKIN: Warm and dry, no edema NEUROLOGIC:  Alert and oriented x 3. No focal neuro deficits noted. PSYCHIATRIC:  Normal affect    ASSESSMENT:    1. Atypical chest pain   2. Nonspecific abnormal electrocardiogram (ECG) (EKG)   3. Nonocclusive coronary  atherosclerosis of native coronary artery   4. Family history of heart disease   5. Cardiac risk counseling   6. Counseling on health promotion and disease prevention   7. Pure hypercholesterolemia   8. Therapeutic drug monitoring     PLAN:    Chest pain Minimal nonocclusive coronary artery disease Family history of heart disease Hypercholesterolemia -had CCTA, calcium score 0.3, small mixed plaque in proximal LAD without stenosis, no other noted disease -Suggests chest pain not cardiac in etiology -changing simvastatin to rosuvastatin today. Check lipids/lfts in 2-3 months. Prior LDL 111 on simvastatin -previously on aspirin, have discussed. With minimal plaque, defer to patient preference  Abnormal ECG: asymptomatic. Normal echo  Cardiac risk counseling and prevention recommendations: -recommend heart healthy/Mediterranean diet, with whole grains, fruits, vegetable, fish, lean meats, nuts, and olive oil. Limit salt. -recommend moderate walking, 3-5 times/week for 30-50 minutes each session. Aim for at least 150 minutes.week. Goal should be pace of 3 miles/hours, or walking 1.5 miles in 30 minutes -recommend avoidance of  tobacco products. Avoid excess alcohol.  Plan for follow up: 1 year or sooner as needed  Buford Dresser, MD, PhD [place]  [facility]    Medication Adjustments/Labs and Tests Ordered: Current medicines are reviewed at length with the patient today.  Concerns regarding medicines are outlined above.   Orders Placed This Encounter  Procedures   EKG 12-Lead   Meds ordered this encounter  Medications   rosuvastatin (CRESTOR) 10 MG tablet    Sig: Take 1 tablet (10 mg total) by mouth daily.    Dispense:  90 tablet    Refill:  3   Patient Instructions  Medication Instructions:  STOP SIMVASTATIN   START ROSUVASTATIN DAILY   *If you need a refill on your cardiac medications before your next appointment, please call your pharmacy*  Lab  Work: FASTING LP/CMET IN 3 MONTHS   If you have labs (blood work) drawn today and your tests are completely normal, you will receive your results only by: [place] (if you have MyChart) OR A paper copy in the mail If you have any lab test that is abnormal or we need to change your treatment, we will call you to review the results.  Testing/Procedures: NONE  Follow-Up: At [facility], you and your health needs are our priority.  As part of our continuing mission to provide you with exceptional heart care, we have created designated Provider Care Teams.  These Care Teams include your primary Cardiologist (physician) and Advanced Practice Providers (APPs -  Physician Assistants and Nurse Practitioners) who all work together to provide you with the care you need, when you need it.  We recommend signing up for the patient portal called "MyChart".  Sign up information is provided on this After Visit Summary.  MyChart is used to connect with patients for Virtual Visits (Telemedicine).  Patients are able to view lab/test results, encounter notes, upcoming appointments, etc.  Non-urgent messages can be sent to your provider as well.   To learn more about what you can do with MyChart, go to [email or web address].    Your next appointment:   12 month(s)  Provider:   Buford Dresser, MD       [facility] [place]ump[place],acting as a scribe for Buford Dresser, MD.,have documented all relevant documentation on the behalf of Buford Dresser, MD,as directed by  Buford Dresser, MD while in the presence of Buford Dresser, MD.  I, Buford Dresser, MD, have reviewed all documentation for this visit. The documentation on 10/08/22 for the exam, diagnosis, procedures, and orders are all accurate and complete.   Signed, Buford Dresser, MD PhD 10/08/2022    [place]

## 2022-10-08 ENCOUNTER — Ambulatory Visit (HOSPITAL_BASED_OUTPATIENT_CLINIC_OR_DEPARTMENT_OTHER): Payer: Medicare PPO | Admitting: Cardiology

## 2022-10-08 ENCOUNTER — Encounter (HOSPITAL_BASED_OUTPATIENT_CLINIC_OR_DEPARTMENT_OTHER): Payer: Self-pay | Admitting: Cardiology

## 2022-10-08 VITALS — BP 134/76 | HR 76 | Ht 67.0 in | Wt 176.5 lb

## 2022-10-08 DIAGNOSIS — R0789 Other chest pain: Secondary | ICD-10-CM

## 2022-10-08 DIAGNOSIS — R9431 Abnormal electrocardiogram [ECG] [EKG]: Secondary | ICD-10-CM | POA: Diagnosis not present

## 2022-10-08 DIAGNOSIS — Z5181 Encounter for therapeutic drug level monitoring: Secondary | ICD-10-CM

## 2022-10-08 DIAGNOSIS — R7309 Other abnormal glucose: Secondary | ICD-10-CM

## 2022-10-08 DIAGNOSIS — Z8249 Family history of ischemic heart disease and other diseases of the circulatory system: Secondary | ICD-10-CM | POA: Diagnosis not present

## 2022-10-08 DIAGNOSIS — Z7189 Other specified counseling: Secondary | ICD-10-CM

## 2022-10-08 DIAGNOSIS — E7800 Pure hypercholesterolemia, unspecified: Secondary | ICD-10-CM

## 2022-10-08 DIAGNOSIS — I2510 Atherosclerotic heart disease of native coronary artery without angina pectoris: Secondary | ICD-10-CM

## 2022-10-08 MED ORDER — ROSUVASTATIN CALCIUM 10 MG PO TABS: 10.0000 mg | ORAL_TABLET | Freq: Every day | ORAL | 3 refills | Status: DC

## 2022-10-08 NOTE — Patient Instructions (Signed)
Medication Instructions:  STOP SIMVASTATIN   START ROSUVASTATIN DAILY   *If you need a refill on your cardiac medications before your next appointment, please call your pharmacy*  Lab Work: FASTING LP/CMET IN 3 MONTHS   If you have labs (blood work) drawn today and your tests are completely normal, you will receive your results only by: [place] (if you have MyChart) OR A paper copy in the mail If you have any lab test that is abnormal or we need to change your treatment, we will call you to review the results.  Testing/Procedures: NONE  Follow-Up: At [facility], you and your health needs are our priority.  As part of our continuing mission to provide you with exceptional heart care, we have created designated Provider Care Teams.  These Care Teams include your primary Cardiologist (physician) and Advanced Practice Providers (APPs -  Physician Assistants and Nurse Practitioners) who all work together to provide you with the care you need, when you need it.  We recommend signing up for the patient portal called "MyChart".  Sign up information is provided on this After Visit Summary.  MyChart is used to connect with patients for Virtual Visits (Telemedicine).  Patients are able to view lab/test results, encounter notes, upcoming appointments, etc.  Non-urgent messages can be sent to your provider as well.   To learn more about what you can do with MyChart, go to [email or web address].    Your next appointment:   12 month(s)  Provider:   Buford Dresser, MD

## 2022-12-24 DIAGNOSIS — Z1231 Encounter for screening mammogram for malignant neoplasm of breast: Secondary | ICD-10-CM | POA: Diagnosis not present

## 2023-01-12 DIAGNOSIS — E7800 Pure hypercholesterolemia, unspecified: Secondary | ICD-10-CM | POA: Diagnosis not present

## 2023-01-12 DIAGNOSIS — Z5181 Encounter for therapeutic drug level monitoring: Secondary | ICD-10-CM | POA: Diagnosis not present

## 2023-01-13 LAB — LIPID PANEL
Chol/HDL Ratio: 2.4 ratio (ref 0.0–4.4)
Cholesterol, Total: 151 mg/dL (ref 100–199)
HDL: 62 mg/dL (ref >39)
LDL Chol Calc (NIH): 73 mg/dL (ref 0–99)
Triglycerides: 84 mg/dL (ref 0–149)
VLDL Cholesterol Cal: 16 mg/dL (ref 5–40)

## 2023-01-13 LAB — COMPREHENSIVE METABOLIC PANEL
ALT: 12 IU/L (ref 0–32)
AST: 21 IU/L (ref 0–40)
Albumin/Globulin Ratio: 2.1 (ref 1.2–2.2)
Albumin: 4.4 g/dL (ref 3.9–4.9)
Alkaline Phosphatase: 108 IU/L (ref 44–121)
BUN/Creatinine Ratio: 10 — ABNORMAL LOW (ref 12–28)
BUN: 10 mg/dL (ref 8–27)
Bilirubin Total: 0.4 mg/dL (ref 0.0–1.2)
CO2: 21 mmol/L (ref 20–29)
Calcium: 9.3 mg/dL (ref 8.7–10.3)
Chloride: 105 mmol/L (ref 96–106)
Creatinine, Ser: 1.02 mg/dL — ABNORMAL HIGH (ref 0.57–1.00)
Globulin, Total: 2.1 g/dL (ref 1.5–4.5)
Glucose: 113 mg/dL — ABNORMAL HIGH (ref 70–99)
Potassium: 4.9 mmol/L (ref 3.5–5.2)
Sodium: 141 mmol/L (ref 134–144)
Total Protein: 6.5 g/dL (ref 6.0–8.5)
eGFR: 60 mL/min/{1.73_m2} (ref >59)

## 2023-05-03 DIAGNOSIS — H2513 Age-related nuclear cataract, bilateral: Secondary | ICD-10-CM | POA: Diagnosis not present

## 2023-05-03 DIAGNOSIS — H524 Presbyopia: Secondary | ICD-10-CM | POA: Diagnosis not present

## 2023-06-03 DIAGNOSIS — N390 Urinary tract infection, site not specified: Secondary | ICD-10-CM | POA: Diagnosis not present

## 2023-06-09 DIAGNOSIS — R350 Frequency of micturition: Secondary | ICD-10-CM | POA: Diagnosis not present

## 2023-06-09 DIAGNOSIS — N3020 Other chronic cystitis without hematuria: Secondary | ICD-10-CM | POA: Diagnosis not present

## 2023-07-04 IMAGING — CT CT HEART MORP W/ CTA COR W/ SCORE W/ CA W/CM &/OR W/O CM
4 of 7 series · 8 of 20 positions shown, 9 images · IV contrast (APPLIED)
Comparison: None.
COMPARISON: None.

Addendum:
EXAM:
OVER-READ INTERPRETATION  CT CHEST

The following report is an over-read performed by radiologist Dr.
Leonardusd Messakh [REDACTED] on 08/25/2021. This over-read
does not include interpretation of cardiac or coronary anatomy or
pathology. The coronary CTA interpretation by the cardiologist is
attached.
CLINICAL DATA: Chest pain
Cardiac CTA
MEDICATIONS:
Sub lingual nitro. 4mg x 2
TECHNIQUE: The patient was scanned on a Siemens [REDACTED]ice scanner. Gantry
rotation speed was 250 msecs. Collimation was 0.6 mm. A 100 kV
prospective scan was triggered in the ascending thoracic aorta at
35-75% of the R-R interval. Average HR during the scan was 60 bpm.
The 3D data set was interpreted on a dedicated work station using
MPR, MIP and VRT modes. A total of 80cc of contrast was used.

[Series 6: best diast · axial · 0.39mm/px · z∈[+1154,+1194]mm · 2 of 303 slices shown, 3 images]
[im 101/303  vessel]
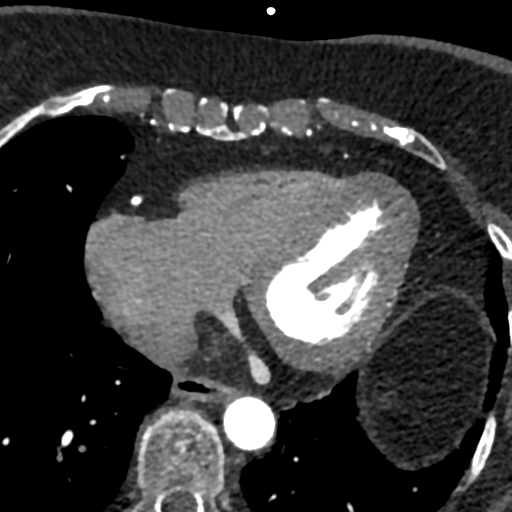
[im 101/303  lung]
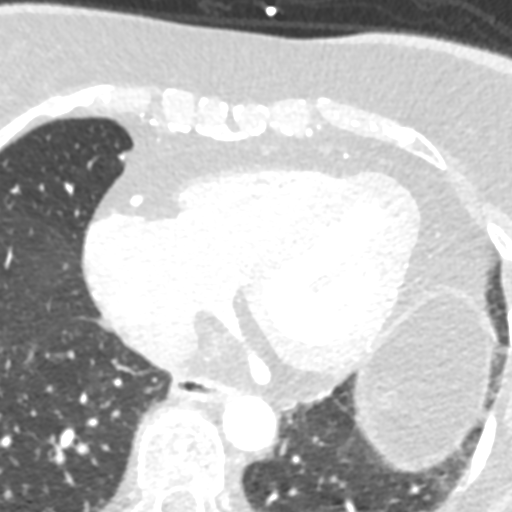
[im 202/303  vessel]
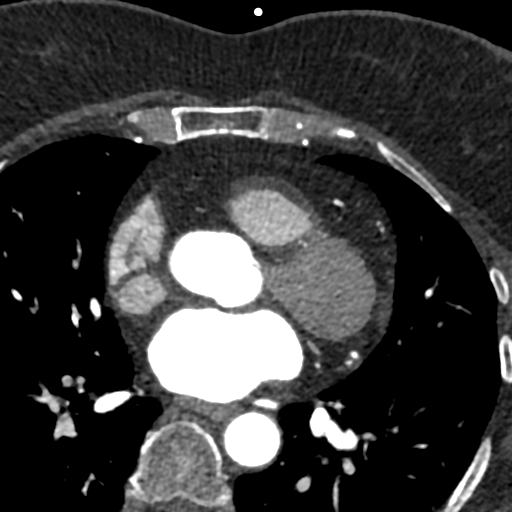

[Series 7: best syst · axial · 0.39mm/px · z∈[+1154,+1194]mm · 2 of 303 slices shown]
[im 101/303  vessel]
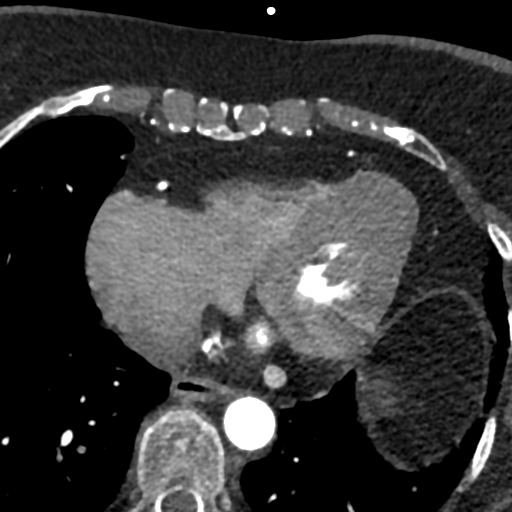
[im 202/303  vessel]
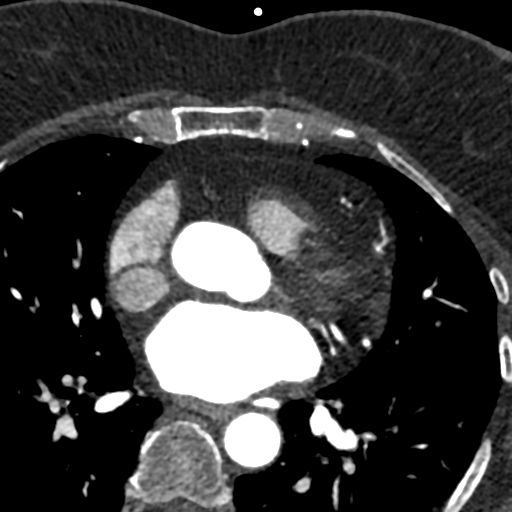

[Series 8: ts diast sharp · axial · 0.39mm/px · z∈[+1154,+1194]mm · 2 of 303 slices shown]
[im 101/303  lung]
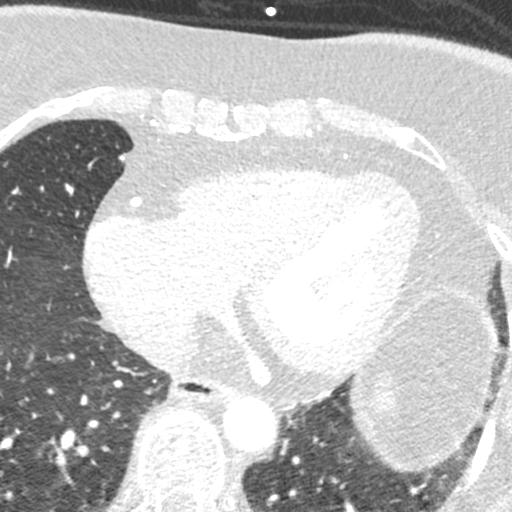
[im 202/303  lung]
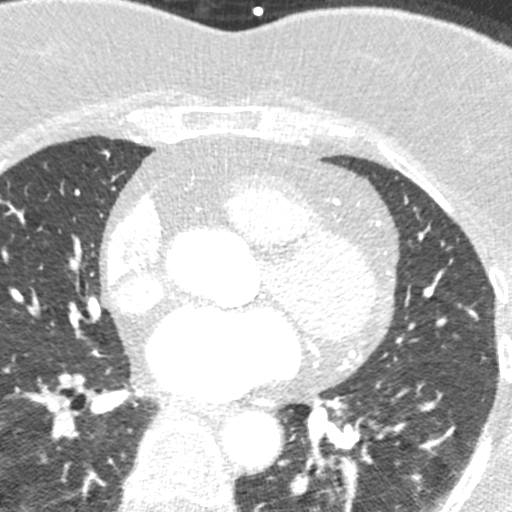

[Series 9: ts syst sharp · axial · 0.39mm/px · z∈[+1154,+1194]mm · 2 of 303 slices shown]
[im 101/303  lung]
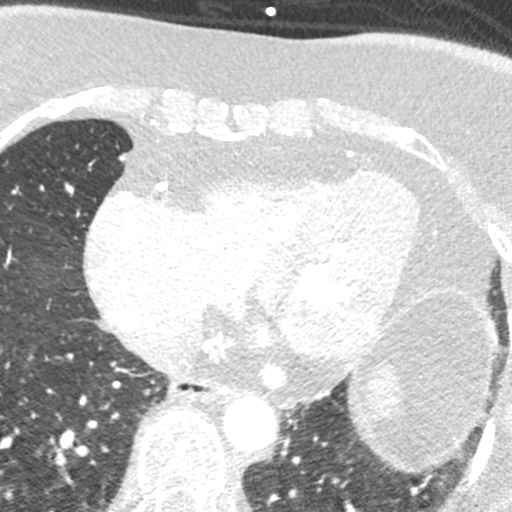
[im 202/303  lung]
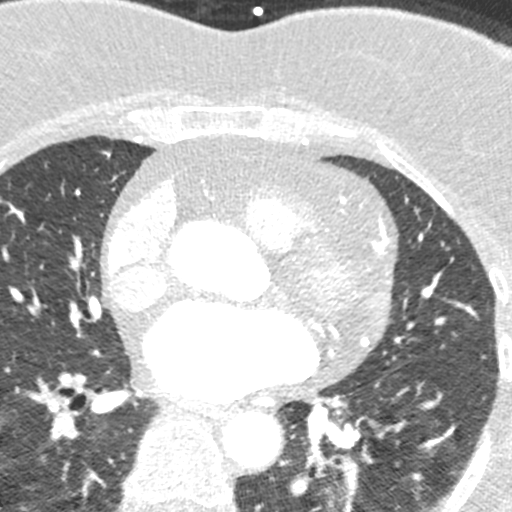

[8 of 20 positions shown; findings below may reference images not displayed]

FINDINGS: Vascular: Heart is normal size.  Aorta normal caliber.

Mediastinum/Nodes: No adenopathy

Lungs/Pleura: No confluent opacities or effusions.

Upper Abdomen: Imaging into the upper abdomen demonstrates no acute
findings.

Musculoskeletal: Chest wall soft tissues are unremarkable. No acute
bony abnormality.
IMPRESSION: No acute or significant extracardiac abnormality.
FINDINGS: Non-cardiac: See separate report from [REDACTED].

The pulmonary veins drain normally to the left atrium. No LA
appendage thrombus.

Calcium Score: 0.3 Agatston units.

Coronary Arteries: Right dominant with no anomalies

LM: No plaque or stenosis.

LAD system: Small area of mixed plaque proximal LAD, no significant
stenosis.

Circumflex system: No plaque or stenosis.

RCA system: No plaque or stenosis.
IMPRESSION: 1. Coronary artery calcium score 0.3 Agatston units. This places the
patient in the 45th percentile for age and gender, suggesting low
risk for future cardiac events.

2.  No obstructive CAD.

Jia Mei Shabudin

*** End of Addendum ***
EXAM:
OVER-READ INTERPRETATION  CT CHEST

The following report is an over-read performed by radiologist Dr.
Leonardusd Messakh [REDACTED] on 08/25/2021. This over-read
does not include interpretation of cardiac or coronary anatomy or
pathology. The coronary CTA interpretation by the cardiologist is
attached.
FINDINGS: Vascular: Heart is normal size.  Aorta normal caliber.

Mediastinum/Nodes: No adenopathy

Lungs/Pleura: No confluent opacities or effusions.

Upper Abdomen: Imaging into the upper abdomen demonstrates no acute
findings.

Musculoskeletal: Chest wall soft tissues are unremarkable. No acute
bony abnormality.
IMPRESSION: No acute or significant extracardiac abnormality.

## 2023-07-14 DIAGNOSIS — G4700 Insomnia, unspecified: Secondary | ICD-10-CM | POA: Diagnosis not present

## 2023-07-14 DIAGNOSIS — Z9181 History of falling: Secondary | ICD-10-CM | POA: Diagnosis not present

## 2023-07-14 DIAGNOSIS — J309 Allergic rhinitis, unspecified: Secondary | ICD-10-CM | POA: Diagnosis not present

## 2023-07-14 DIAGNOSIS — Z0000 Encounter for general adult medical examination without abnormal findings: Secondary | ICD-10-CM | POA: Diagnosis not present

## 2023-07-14 DIAGNOSIS — Z23 Encounter for immunization: Secondary | ICD-10-CM | POA: Diagnosis not present

## 2023-07-14 DIAGNOSIS — E785 Hyperlipidemia, unspecified: Secondary | ICD-10-CM | POA: Diagnosis not present

## 2023-07-14 DIAGNOSIS — E2839 Other primary ovarian failure: Secondary | ICD-10-CM | POA: Diagnosis not present

## 2023-07-14 DIAGNOSIS — R7303 Prediabetes: Secondary | ICD-10-CM | POA: Diagnosis not present

## 2023-09-05 ENCOUNTER — Other Ambulatory Visit (HOSPITAL_BASED_OUTPATIENT_CLINIC_OR_DEPARTMENT_OTHER): Payer: Self-pay | Admitting: Cardiology

## 2023-09-05 DIAGNOSIS — I2510 Atherosclerotic heart disease of native coronary artery without angina pectoris: Secondary | ICD-10-CM

## 2023-09-05 DIAGNOSIS — E7800 Pure hypercholesterolemia, unspecified: Secondary | ICD-10-CM

## 2023-11-19 ENCOUNTER — Encounter (HOSPITAL_BASED_OUTPATIENT_CLINIC_OR_DEPARTMENT_OTHER): Payer: Self-pay | Admitting: Cardiology

## 2023-11-19 ENCOUNTER — Ambulatory Visit (HOSPITAL_BASED_OUTPATIENT_CLINIC_OR_DEPARTMENT_OTHER): Payer: Medicare PPO | Admitting: Cardiology

## 2023-11-19 VITALS — BP 130/74 | HR 64 | Ht 67.0 in | Wt 174.3 lb

## 2023-11-19 DIAGNOSIS — Z8249 Family history of ischemic heart disease and other diseases of the circulatory system: Secondary | ICD-10-CM

## 2023-11-19 DIAGNOSIS — R9431 Abnormal electrocardiogram [ECG] [EKG]: Secondary | ICD-10-CM

## 2023-11-19 DIAGNOSIS — E7800 Pure hypercholesterolemia, unspecified: Secondary | ICD-10-CM | POA: Diagnosis not present

## 2023-11-19 DIAGNOSIS — I2510 Atherosclerotic heart disease of native coronary artery without angina pectoris: Secondary | ICD-10-CM | POA: Diagnosis not present

## 2023-11-19 MED ORDER — ROSUVASTATIN CALCIUM 20 MG PO TABS: 20.0000 mg | ORAL_TABLET | Freq: Every day | ORAL | 3 refills | Status: DC

## 2023-11-19 NOTE — Progress Notes (Signed)
 Cardiology Office Note:  .   Date:  11/19/2023  ID:  Alejandra Thompson, DOB [birth year], MRN [id number] PCP: Camie Patience, FNP  [facility] Providers Cardiologist:  Jodelle Red, MD {  History of Present Illness: Alejandra Kitchen   Cerria Thompson is a 73 y.o. female with a hx of benign thrombocytopenia, minimal nonobstructive CAD, hypercholesterolemia, who is seen for follow-up. I initially met her 10/31/2019 as a new consult for the evaluation and management of abnormal ECG.   Today: Doing well overall. On chronic antibiotics for UTI suppression.  Has been exercising more now than ever before in her life. Does silver sneakers programs. Does at 45 min class twice/week that does cardio/weights/resistance band training. Also does gentle yoga class. Did great walking at [facility] recently, no symptoms.  Trying to eat healthy, limiting her carbs. Weight down 2 lbs on our scale.  ROS: Denies chest pain, shortness of breath at rest or with normal exertion. No PND, orthopnea, LE edema or unexpected weight gain. No syncope or palpitations. ROS otherwise negative except as noted.   Studies Reviewed: Alejandra Kitchen    EKG:  EKG Interpretation Date/Time:  Friday November 19 2023 13:16:18 EST Ventricular Rate:  68 PR Interval:  136 QRS Duration:  76 QT Interval:  432 QTC Calculation: 459 R Axis:   62  Text Interpretation: Normal sinus rhythm Septal infarct , age undetermined Diffuse ST-T wave abnormality Confirmed by Jodelle Red [id number]) on 11/19/2023 1:28:25 PM    Physical Exam:   VS:  BP 130/74   Pulse 64   Ht 5\' 7"  (1.702 m)   Wt 174 lb 4.8 oz (79.1 kg)   SpO2 97%   BMI 27.30 kg/m    Wt Readings from Last 3 Encounters:  11/19/23 174 lb 4.8 oz (79.1 kg)  10/08/22 176 lb 8 oz (80.1 kg)  08/08/21 173 lb 3.2 oz (78.6 kg)    GEN: Well nourished, well developed in no acute distress HEENT: Normal, moist mucous membranes NECK: No JVD CARDIAC: regular rhythm, normal S1 and S2, no rubs or gallops. No  murmur. VASCULAR: Radial and DP pulses 2+ bilaterally. No carotid bruits RESPIRATORY:  Clear to auscultation without rales, wheezing or rhonchi  ABDOMEN: Soft, non-tender, non-distended MUSCULOSKELETAL:  Ambulates independently SKIN: Warm and dry, no edema NEUROLOGIC:  Alert and oriented x 3. No focal neuro deficits noted. PSYCHIATRIC:  Normal affect    ASSESSMENT AND PLAN: .    Minimal nonocclusive coronary artery disease Family history of heart disease Hypercholesterolemia -CCTA 08/2021:calcium score 0.3, small mixed plaque in proximal LAD without stenosis, no other noted disease -no further chest pain -Prior LDL 111 on simvastatin. Changed to rosuvastatin, initial LDL on this was 73. Most recent LDL per [facility] is 91 06/2023. -we discussed that goal LDL is <70. Discussed options today. After shared decision making, will increased rosuvastatin to 20 mg dose and recheck lipids in 2-3 months. -previously on aspirin, have discussed. With minimal plaque, defer to patient preference. There is reported history of benign thrombocytopenia, I do not have recent labs to assess this.   Abnormal ECG, diffuse ST-T wave abnormalities, unchanged today -asymptomatic. Normal echo  CV risk counseling and prevention -recommend heart healthy/Mediterranean diet, with whole grains, fruits, vegetable, fish, lean meats, nuts, and olive oil. Limit salt. -recommend moderate walking, 3-5 times/week for 30-50 minutes each session. Aim for at least 150 minutes.week. Goal should be pace of 3 miles/hours, or walking 1.5 miles in 30 minutes -recommend avoidance of tobacco products. Avoid excess  alcohol.  Dispo: 2 years or sooner as needed  Signed, Jodelle Red, MD   Jodelle Red, MD, PhD, [facility] [place]  [facility]  [place]  [facility] at [facility] [address], [address] [place], [place] [address] [phone]

## 2023-11-19 NOTE — Patient Instructions (Signed)
 Medication Instructions:  Your physician has recommended you make the following change in your medication:   START Rosuvastatin 20 mg daily  Lab Work: Your physician recommends that you return for lab work in 3 months: LIPID If you have labs (blood work) drawn today and your tests are completely normal, you will receive your results only by: MyChart Message (if you have MyChart) OR A paper copy in the mail If you have any lab test that is abnormal or we need to change your treatment, we will call you to review the results.   Follow-Up: At [facility], you and your health needs are our priority.  As part of our continuing mission to provide you with exceptional heart care, we have created designated Provider Care Teams.  These Care Teams include your primary Cardiologist (physician) and Advanced Practice Providers (APPs -  Physician Assistants and Nurse Practitioners) who all work together to provide you with the care you need, when you need it.  We recommend signing up for the patient portal called "MyChart".  Sign up information is provided on this After Visit Summary.  MyChart is used to connect with patients for Virtual Visits (Telemedicine).  Patients are able to view lab/test results, encounter notes, upcoming appointments, etc.  Non-urgent messages can be sent to your provider as well.   To learn more about what you can do with MyChart, go to [email or web address].    Your next appointment:   2 year(s)  Provider:   Jodelle Red, MD

## 2024-02-18 LAB — LIPID PANEL
Chol/HDL Ratio: 2.5 ratio (ref 0.0–4.4)
Cholesterol, Total: 154 mg/dL (ref 100–199)
HDL: 61 mg/dL (ref >39)
LDL Chol Calc (NIH): 77 mg/dL (ref 0–99)
Triglycerides: 86 mg/dL (ref 0–149)
VLDL Cholesterol Cal: 16 mg/dL (ref 5–40)

## 2024-03-05 ENCOUNTER — Ambulatory Visit (HOSPITAL_BASED_OUTPATIENT_CLINIC_OR_DEPARTMENT_OTHER): Payer: Self-pay | Admitting: Cardiology

## 2024-03-05 DIAGNOSIS — I2510 Atherosclerotic heart disease of native coronary artery without angina pectoris: Secondary | ICD-10-CM

## 2024-03-05 DIAGNOSIS — E7800 Pure hypercholesterolemia, unspecified: Secondary | ICD-10-CM

## 2024-03-06 MED ORDER — ROSUVASTATIN CALCIUM 40 MG PO TABS: 40.0000 mg | ORAL_TABLET | Freq: Every day | ORAL | 3 refills | Status: AC

## 2024-03-06 NOTE — Telephone Encounter (Signed)
 Called and spoke to pt. DOB and full name verified. Discussed MD's recommendations. Pt is already making lifestyle changes (dieting and exercising). Discussed MD's comments on increasing mediation and rechecking labs in a couple months. Pt is in agreement. Advised pt to take two 20 mg tablets (40 mg) and to pick up her new RX when she finishes her current RX. Lab slips mailed to pt. Pt verbalized understanding.   Sheryle Donning, MD 03/05/2024  7:22 PM EDT  Please call the patient and review the following:   Cholesterol numbers are better than they were on simvastatin, but still not quite at goal (goal is <70, LDL is 77). Options are to keep current rosuvastatin  20 mg dose and focus on making lifestyle changes (diet/exercise); option 2 is to increase the rosuvastatin  dose from 20 to 40 mg daily and recheck cholesterol in another 2-3 months; option 3 is to keep current rosuvastatin  dose and add ezetimibe 10 mg daily, recheck lipids in 2-3 months.   My suggestion: If she feels that lifestyle change is reasonable (she has areas she can work on and feels comfortable making the changes), since we are within 10 points, we can start with this. If however, she doesn't have clear areas for lifestyle improvement, and she is tolerating the rosuvastatin , then I think increasing the dose to 40 mg daily is the easiest method. If she doesn't tolerate going up to 40 mg rosuvastatin  dose, we could then drop back to the current 20 mg dose and add zetia at that time.

## 2024-03-06 NOTE — Telephone Encounter (Signed)
-----   Message from [facility] sent at 03/05/2024  7:22 PM EDT ----- Please call the patient and review the following:  Cholesterol numbers are better than they were on simvastatin, but still not quite at goal (goal is <70, LDL is 77). Options are to keep current rosuvastatin  20 mg dose and focus on making lifestyle  changes (diet/exercise); option 2 is to increase the rosuvastatin  dose from 20 to 40 mg daily and recheck cholesterol in another 2-3 months; option 3 is to keep current rosuvastatin  dose and add  ezetimibe 10 mg daily, recheck lipids in 2-3 months.  My suggestion: If she feels that lifestyle change is reasonable (she has areas she can work on and feels comfortable making the changes), since we are within 10 points, we can start with this. If  however, she doesn't have clear areas for lifestyle improvement, and she is tolerating the rosuvastatin , then I think increasing the dose to 40 mg daily is the easiest method. If she doesn't tolerate  going up to 40 mg rosuvastatin  dose, we could then drop back to the current 20 mg dose and add zetia at that time. ----- Message ----- From: Interface, [facility] Lab Results In Sent: 02/18/2024   2:35 AM EDT To: Sheryle Donning, MD

## 2024-05-24 LAB — LIPID PANEL
Chol/HDL Ratio: 2.4 ratio (ref 0.0–4.4)
Cholesterol, Total: 141 mg/dL (ref 100–199)
HDL: 59 mg/dL (ref >39)
LDL Chol Calc (NIH): 63 mg/dL (ref 0–99)
Triglycerides: 101 mg/dL (ref 0–149)
VLDL Cholesterol Cal: 19 mg/dL (ref 5–40)

## 2024-07-05 ENCOUNTER — Ambulatory Visit (INDEPENDENT_AMBULATORY_CARE_PROVIDER_SITE_OTHER)

## 2024-07-05 ENCOUNTER — Encounter: Payer: Self-pay | Admitting: Podiatry

## 2024-07-05 ENCOUNTER — Ambulatory Visit: Admitting: Podiatry

## 2024-07-05 DIAGNOSIS — M7752 Other enthesopathy of left foot: Secondary | ICD-10-CM | POA: Diagnosis not present

## 2024-07-05 DIAGNOSIS — M19071 Primary osteoarthritis, right ankle and foot: Secondary | ICD-10-CM | POA: Diagnosis not present

## 2024-07-05 DIAGNOSIS — M216X1 Other acquired deformities of right foot: Secondary | ICD-10-CM | POA: Diagnosis not present

## 2024-07-05 DIAGNOSIS — M19072 Primary osteoarthritis, left ankle and foot: Secondary | ICD-10-CM

## 2024-07-05 DIAGNOSIS — M216X2 Other acquired deformities of left foot: Secondary | ICD-10-CM

## 2024-07-05 DIAGNOSIS — M7751 Other enthesopathy of right foot: Secondary | ICD-10-CM | POA: Diagnosis not present

## 2024-07-05 NOTE — Progress Notes (Signed)
  Subjective:  Patient ID: Alejandra Thompson, female    DOB: [birth year],   MRN: [id number]  Chief Complaint  Patient presents with   Foot Pain    I'm having pain across the top of both feet.    73 y.o. female presents for concern of bilateral foot pain that has been ongoing for a while. Relates comes and goes and mostly on the top of her feet. She has had CMO in the past and these have helped but noticed recently they may be causing her some of the pain. She wears birkenstocks as well.   . Denies any other pedal complaints. Denies n/v/f/c.   Past Medical History:  Diagnosis Date   Hyperlipemia    Platelets decreased    pt states her platelets are low to normal   Seasonal allergies    UTI (urinary tract infection)     Objective:  Physical Exam: Vascular: DP/PT pulses 2/4 bilateral. CFT <3 seconds. Normal hair growth on digits. No edema.  Skin. No lacerations or abrasions bilateral feet.  Musculoskeletal: MMT 5/5 bilateral lower extremities in DF, PF, Inversion and Eversion. Deceased ROM in DF of ankle joint. Pes cavus noted bilateral feet. Some tenderness to dorsal midfoot area around second tarsometatarsal joint.  Neurological: Sensation intact to light touch.   Assessment:   1. Arthritis of both midfeet   2. Acquired bilateral pes cavus      Plan:  Patient was evaluated and treated and all questions answered. -Xrays reviewed. No acute fractures or dislocations noted. Degenerative changes of first MPJ bilateral with lose of joint space. Some degenerative changes in the midfoot area as well.  Pes cavus noted bilateral.  Discussed midfoot arthritis with patient and treatment options.  Discussed NSAIDS, topicals, and possible injections.  Deferred anti-inflammatories today.  Discussed stiff soled shoes and carbon fiber foot plate. Will consider CMO.  Discussed if pain does not improve can discuss surgical options.  Patient to follow-up as needed.     Asberry Failing, DPM

## 2024-07-17 ENCOUNTER — Ambulatory Visit

## 2024-07-17 NOTE — Progress Notes (Signed)
 Orthotics   Patient was present and evaluated for Custom molded foot orthotics. Patient will benefit from CFO's to provide total contact to BIL MLA's helping to balance and distribute body weight more evenly across BIL feet helping to reduce plantar pressure and pain. Orthotic will also encourage FF / RF alignment  Patient was scanned today and will return for fitting upon receipt  [employer] active usually considers L3020 a non covered Item patient is aware \\  Lolita Schultze CPed, CFo, CFm

## 2024-08-14 ENCOUNTER — Encounter: Payer: Self-pay | Admitting: Podiatry

## 2024-08-14 ENCOUNTER — Ambulatory Visit: Admitting: Podiatry

## 2024-08-14 DIAGNOSIS — M19071 Primary osteoarthritis, right ankle and foot: Secondary | ICD-10-CM | POA: Diagnosis not present

## 2024-08-14 DIAGNOSIS — M19072 Primary osteoarthritis, left ankle and foot: Secondary | ICD-10-CM | POA: Diagnosis not present

## 2024-08-14 DIAGNOSIS — M216X2 Other acquired deformities of left foot: Secondary | ICD-10-CM | POA: Diagnosis not present

## 2024-08-14 DIAGNOSIS — M216X1 Other acquired deformities of right foot: Secondary | ICD-10-CM | POA: Diagnosis not present

## 2024-08-14 NOTE — Progress Notes (Signed)
 Patient presents today to pick up custom molded foot orthotics recommended by Dr. Asberry Failing.   Orthotics were dispensed and fit was satisfactory. Reviewed instructions for break-in and wear. Written instructions given to patient.  Patient will follow up as needed.   Cristie [facility]

## 2024-09-28 ENCOUNTER — Encounter: Payer: Self-pay | Admitting: Family

## 2024-09-28 ENCOUNTER — Ambulatory Visit: Admitting: Family

## 2024-09-28 VITALS — BP 118/80 | HR 74 | Temp 97.2°F | Ht 67.0 in | Wt 172.5 lb

## 2024-09-28 DIAGNOSIS — Z860101 Personal history of adenomatous and serrated colon polyps: Secondary | ICD-10-CM | POA: Insufficient documentation

## 2024-09-28 DIAGNOSIS — J309 Allergic rhinitis, unspecified: Secondary | ICD-10-CM | POA: Insufficient documentation

## 2024-09-28 DIAGNOSIS — N390 Urinary tract infection, site not specified: Secondary | ICD-10-CM | POA: Diagnosis not present

## 2024-09-28 DIAGNOSIS — R7303 Prediabetes: Secondary | ICD-10-CM | POA: Diagnosis not present

## 2024-09-28 DIAGNOSIS — Z1159 Encounter for screening for other viral diseases: Secondary | ICD-10-CM

## 2024-09-28 DIAGNOSIS — E2839 Other primary ovarian failure: Secondary | ICD-10-CM | POA: Insufficient documentation

## 2024-09-28 DIAGNOSIS — G4700 Insomnia, unspecified: Secondary | ICD-10-CM | POA: Insufficient documentation

## 2024-09-28 DIAGNOSIS — E785 Hyperlipidemia, unspecified: Secondary | ICD-10-CM | POA: Insufficient documentation

## 2024-09-28 DIAGNOSIS — M8580 Other specified disorders of bone density and structure, unspecified site: Secondary | ICD-10-CM | POA: Insufficient documentation

## 2024-09-28 DIAGNOSIS — N289 Disorder of kidney and ureter, unspecified: Secondary | ICD-10-CM | POA: Diagnosis not present

## 2024-09-28 LAB — BASIC METABOLIC PANEL WITH GFR
BUN: 14 mg/dL (ref 6–23)
CO2: 30 meq/L (ref 19–32)
Calcium: 9.1 mg/dL (ref 8.4–10.5)
Chloride: 102 meq/L (ref 96–112)
Creatinine, Ser: 1 mg/dL (ref 0.40–1.20)
GFR: 56.68 mL/min — ABNORMAL LOW
Glucose, Bld: 89 mg/dL (ref 70–99)
Potassium: 4.5 meq/L (ref 3.5–5.1)
Sodium: 137 meq/L (ref 135–145)

## 2024-09-28 NOTE — Progress Notes (Unsigned)
 "  Patient ID: Alejandra Thompson, female    DOB: 1950/11/08, 74 y.o.   MRN: [id number]  Chief Complaint  Patient presents with   New Patient (Initial Visit)    Recently switched PCP from [facility].    Lab results    Pt would like GFR rechecked  Discussed the use of AI scribe software for clinical note transcription with the patient, who gave verbal consent to proceed.  History of Present Illness Alejandra Thompson is a 74 year old female who presents with concerns about an abnormal GFR level indicating potential kidney issues.  Labs drawn on October 31 showed an abnormal GFR, which she saw on the patient portal without direct explanation. She is worried about possible kidney disease and wants clarification of the result. She has chronic UTIs managed with low-dose antibiotic suppression for about ten years. She stopped the antibiotic in May of last year and developed a UTI by September, so she restarted prophylaxis. She was changed from nitrofurantoin to trimethoprim due to concern for side effects. She takes Crestor  daily, Singulair nightly, Flonase, vitamin D, and occasional Ambien. She uses Advil for arthritis pain in her feet, which were diagnosed as arthritic by a podiatrist. Her A1c has been mildly elevated at about 6.0 to 6.1 for years without a family history of diabetes. Her cholesterol is managed by a cardiologist, and a urologist manages her UTIs.  Assessment & Plan Renal insufficiency Last GFR of 54, below normal. No prior low GFR history. Possible dehydration and NSAID use. Single low reading not diagnostic of kidney disease. Emphasized hydration and NSAID caution. - Recheck GFR today. - Encouraged hydration with 2-2.5 liters of caffeine-free beverages daily. - Advised to reduce NSAID use, consider Tylenol  Arthritis or Aleve (naproxen) as alternatives.  Chronic urinary tract infections Managed with trimethoprim 100mg  qd. No recent UTIs. Trimethoprim does not significantly affect kidney function but  can lower blood counts and cause sun sensitivity. - Continue current trimethoprim regimen. - Monitor for any side effects such as sun sensitivity or blood count changes.  Prediabetes Slightly elevated A1c. No family diabetes history. Weight stable. Discussed dietary modifications to manage A1c levels. - Will continue to monitor A1c levels. - Advised dietary modifications to reduce white carbs and increase protein intake. - Can consider low dose metformin if A1c approaches 6.5.  General Health Maintenance Discussed hepatitis C screening due to historical blood supply issues. No prior screening reported. - Ordered hepatitis C screening. - Ordered basic metabolic panel.    Subjective:    Outpatient Medications Prior to Visit  Medication Sig Dispense Refill   Cholecalciferol (VITAMIN D3) 25 MCG (1000 UT) CAPS 1 capsule     fluticasone (FLONASE) 50 MCG/ACT nasal spray 1 spray in each nostril Nasally Once a day as needed     montelukast (SINGULAIR) 10 MG tablet Take 10 mg by mouth at bedtime.      rosuvastatin  (CRESTOR ) 40 MG tablet Take 1 tablet (40 mg total) by mouth daily. 90 tablet 3   trimethoprim (TRIMPEX) 100 MG tablet Take 100 mg by mouth daily.     zolpidem (AMBIEN) 5 MG tablet Take 5 mg by mouth at bedtime as needed for sleep.     No facility-administered medications prior to visit.   Past Medical History:  Diagnosis Date   Allergic rhinitis 09/28/2024   Closed fracture of left patella 10/26/2017   Decreased estrogen level 09/28/2024   History of adenomatous polyp of colon 09/28/2024   Hyperlipemia    Hyperlipidemia 09/28/2024  Insomnia 09/28/2024   Osteopenia 09/28/2024   Platelets decreased    pt states her platelets are low to normal   Prediabetes 09/28/2024   Seasonal allergies    UTI (urinary tract infection)    Past Surgical History:  Procedure Laterality Date   COLONOSCOPY     ORIF PATELLA Left 05/07/2017   Procedure: OPEN REDUCTION INTERNAL (ORIF)  FIXATION LEFT PATELLA;  Surgeon: Fidel Rogue, MD;  Location: [facility] OR;  Service: Orthopedics;  Laterality: Left;   Allergies[1]    Objective:    Physical Exam Vitals and nursing note reviewed.  Constitutional:      Appearance: Normal appearance.  Cardiovascular:     Rate and Rhythm: Normal rate and regular rhythm.  Pulmonary:     Effort: Pulmonary effort is normal.     Breath sounds: Normal breath sounds.  Musculoskeletal:        General: Normal range of motion.  Skin:    General: Skin is warm and dry.  Neurological:     Mental Status: She is alert.  Psychiatric:        Mood and Affect: Mood normal.        Behavior: Behavior normal.    BP 118/80 (BP Location: Left Arm, Patient Position: Sitting, Cuff Size: Normal)   Pulse 74   Temp (!) 97.2 F (36.2 C) (Temporal)   Ht 5' 7 (1.702 m)   Wt 172 lb 8 oz (78.2 kg)   SpO2 98%   BMI 27.02 kg/m  Wt Readings from Last 3 Encounters:  09/28/24 172 lb 8 oz (78.2 kg)  11/19/23 174 lb 4.8 oz (79.1 kg)  10/08/22 176 lb 8 oz (80.1 kg)      Jaidan Stachnik, NP     [1]  Allergies Allergen Reactions   No Known Allergies    "

## 2024-09-28 NOTE — Patient Instructions (Addendum)
 Welcome to [facility], It was a pleasure meeting you today!   I will review your lab results via MyChart or via phone call in a few days.  Please schedule a physical with fasting labs towards the end of the year when needed.     PLEASE NOTE: If you had any LAB tests please let us  know if you have not heard back within a few days. You may see your results on MyChart before we have a chance to review them but we will give you a call once they are reviewed by us . If we ordered any REFERRALS today, please let us  know if you have not heard from their office within the next week.  Let us  know through MyChart if you are needing REFILLS, or have your pharmacy send us  the request. You can also use MyChart to communicate with me or any office staff.  Please try these tips to maintain a healthy lifestyle: It is important that you exercise regularly at least 30 minutes 5 times a week. Think about what you will eat, plan ahead. Choose whole foods, & think  clean, green, fresh or frozen over canned, processed or packaged foods which are more sugary, salty, and fatty. 70 to 75% of food eaten should be fresh vegetables and protein. 2-3  meals daily with healthy snacks between meals, but must be whole fruit, protein or vegetables. Aim to eat over a 10 hour period when you are active, for example, 7am to 5pm, and then STOP after your last meal of the day, drinking only water.  Shorter eating windows, 6-8 hours, are showing benefits in heart disease and blood sugar regulation. Drink water every day! Shoot for 64 ounces daily = 8 cups, no other drink is as healthy! Fruit juice is best enjoyed in a healthy way, by EATING the fruit.

## 2024-09-29 ENCOUNTER — Ambulatory Visit: Payer: Self-pay | Admitting: Family

## 2024-09-29 DIAGNOSIS — N1831 Chronic kidney disease, stage 3a: Secondary | ICD-10-CM | POA: Insufficient documentation

## 2024-09-29 LAB — HEPATITIS C ANTIBODY: Hepatitis C Ab: NONREACTIVE

## 2025-07-24 ENCOUNTER — Encounter: Admitting: Family
